# Patient Record
Sex: Male | Born: 1935
Health system: Southern US, Community
[De-identification: ages and names within clinical notes are randomized; demographics above are authoritative.]

## PROBLEM LIST (undated history)

## (undated) DIAGNOSIS — E119 Type 2 diabetes mellitus without complications: Secondary | ICD-10-CM

---

## 1999-11-28 ENCOUNTER — Emergency Department (HOSPITAL_COMMUNITY): Admission: EM | Admit: 1999-11-28 | Discharge: 1999-11-29 | Payer: Self-pay | Admitting: Emergency Medicine

## 2005-10-17 ENCOUNTER — Ambulatory Visit (HOSPITAL_COMMUNITY)
Admission: RE | Admit: 2005-10-17 | Discharge: 2005-10-17 | Payer: Self-pay | Admitting: Physical Medicine and Rehabilitation

## 2005-11-08 ENCOUNTER — Encounter
Admission: RE | Admit: 2005-11-08 | Discharge: 2005-11-08 | Payer: Self-pay | Admitting: Physical Medicine and Rehabilitation

## 2014-12-23 DIAGNOSIS — E1122 Type 2 diabetes mellitus with diabetic chronic kidney disease: Secondary | ICD-10-CM | POA: Diagnosis not present

## 2014-12-23 DIAGNOSIS — N08 Glomerular disorders in diseases classified elsewhere: Secondary | ICD-10-CM | POA: Diagnosis not present

## 2014-12-23 DIAGNOSIS — I129 Hypertensive chronic kidney disease with stage 1 through stage 4 chronic kidney disease, or unspecified chronic kidney disease: Secondary | ICD-10-CM | POA: Diagnosis not present

## 2014-12-23 DIAGNOSIS — N182 Chronic kidney disease, stage 2 (mild): Secondary | ICD-10-CM | POA: Diagnosis not present

## 2015-03-26 DIAGNOSIS — E1122 Type 2 diabetes mellitus with diabetic chronic kidney disease: Secondary | ICD-10-CM | POA: Diagnosis not present

## 2015-03-26 DIAGNOSIS — E782 Mixed hyperlipidemia: Secondary | ICD-10-CM | POA: Diagnosis not present

## 2015-03-26 DIAGNOSIS — E559 Vitamin D deficiency, unspecified: Secondary | ICD-10-CM | POA: Diagnosis not present

## 2015-03-26 DIAGNOSIS — I129 Hypertensive chronic kidney disease with stage 1 through stage 4 chronic kidney disease, or unspecified chronic kidney disease: Secondary | ICD-10-CM | POA: Diagnosis not present

## 2015-07-01 DIAGNOSIS — E1122 Type 2 diabetes mellitus with diabetic chronic kidney disease: Secondary | ICD-10-CM | POA: Diagnosis not present

## 2015-07-01 DIAGNOSIS — Z0000 Encounter for general adult medical examination without abnormal findings: Secondary | ICD-10-CM | POA: Diagnosis not present

## 2015-07-01 DIAGNOSIS — E782 Mixed hyperlipidemia: Secondary | ICD-10-CM | POA: Diagnosis not present

## 2015-07-01 DIAGNOSIS — N182 Chronic kidney disease, stage 2 (mild): Secondary | ICD-10-CM | POA: Diagnosis not present

## 2015-07-01 DIAGNOSIS — H6123 Impacted cerumen, bilateral: Secondary | ICD-10-CM | POA: Diagnosis not present

## 2015-07-01 DIAGNOSIS — I129 Hypertensive chronic kidney disease with stage 1 through stage 4 chronic kidney disease, or unspecified chronic kidney disease: Secondary | ICD-10-CM | POA: Diagnosis not present

## 2016-04-06 DIAGNOSIS — H524 Presbyopia: Secondary | ICD-10-CM | POA: Diagnosis not present

## 2016-04-06 DIAGNOSIS — E119 Type 2 diabetes mellitus without complications: Secondary | ICD-10-CM | POA: Diagnosis not present

## 2016-09-19 DIAGNOSIS — N182 Chronic kidney disease, stage 2 (mild): Secondary | ICD-10-CM | POA: Diagnosis not present

## 2016-09-19 DIAGNOSIS — N08 Glomerular disorders in diseases classified elsewhere: Secondary | ICD-10-CM | POA: Diagnosis not present

## 2016-09-19 DIAGNOSIS — E782 Mixed hyperlipidemia: Secondary | ICD-10-CM | POA: Diagnosis not present

## 2016-09-19 DIAGNOSIS — E559 Vitamin D deficiency, unspecified: Secondary | ICD-10-CM | POA: Diagnosis not present

## 2016-09-19 DIAGNOSIS — Z0000 Encounter for general adult medical examination without abnormal findings: Secondary | ICD-10-CM | POA: Diagnosis not present

## 2016-09-19 DIAGNOSIS — H6123 Impacted cerumen, bilateral: Secondary | ICD-10-CM | POA: Diagnosis not present

## 2016-09-19 DIAGNOSIS — E1122 Type 2 diabetes mellitus with diabetic chronic kidney disease: Secondary | ICD-10-CM | POA: Diagnosis not present

## 2017-01-23 DIAGNOSIS — N182 Chronic kidney disease, stage 2 (mild): Secondary | ICD-10-CM | POA: Diagnosis not present

## 2017-01-23 DIAGNOSIS — E559 Vitamin D deficiency, unspecified: Secondary | ICD-10-CM | POA: Diagnosis not present

## 2017-01-23 DIAGNOSIS — E1122 Type 2 diabetes mellitus with diabetic chronic kidney disease: Secondary | ICD-10-CM | POA: Diagnosis not present

## 2017-01-23 DIAGNOSIS — I129 Hypertensive chronic kidney disease with stage 1 through stage 4 chronic kidney disease, or unspecified chronic kidney disease: Secondary | ICD-10-CM | POA: Diagnosis not present

## 2017-01-23 DIAGNOSIS — N08 Glomerular disorders in diseases classified elsewhere: Secondary | ICD-10-CM | POA: Diagnosis not present

## 2017-01-23 DIAGNOSIS — E782 Mixed hyperlipidemia: Secondary | ICD-10-CM | POA: Diagnosis not present

## 2017-04-21 ENCOUNTER — Encounter (HOSPITAL_COMMUNITY): Payer: Self-pay

## 2017-04-21 ENCOUNTER — Emergency Department (HOSPITAL_COMMUNITY): Payer: Medicare HMO

## 2017-04-21 ENCOUNTER — Inpatient Hospital Stay (HOSPITAL_COMMUNITY)
Admission: EM | Admit: 2017-04-21 | Discharge: 2017-04-23 | DRG: 871 | Disposition: A | Discharge status: Home Health | Payer: Medicare HMO | Attending: Internal Medicine | Admitting: Internal Medicine

## 2017-04-21 ENCOUNTER — Other Ambulatory Visit: Payer: Self-pay

## 2017-04-21 DIAGNOSIS — J9801 Acute bronchospasm: Secondary | ICD-10-CM | POA: Diagnosis present

## 2017-04-21 DIAGNOSIS — A4189 Other specified sepsis: Secondary | ICD-10-CM | POA: Diagnosis not present

## 2017-04-21 DIAGNOSIS — Z7984 Long term (current) use of oral hypoglycemic drugs: Secondary | ICD-10-CM

## 2017-04-21 DIAGNOSIS — G9340 Encephalopathy, unspecified: Secondary | ICD-10-CM | POA: Diagnosis not present

## 2017-04-21 DIAGNOSIS — J101 Influenza due to other identified influenza virus with other respiratory manifestations: Secondary | ICD-10-CM | POA: Diagnosis not present

## 2017-04-21 DIAGNOSIS — R4182 Altered mental status, unspecified: Secondary | ICD-10-CM | POA: Diagnosis not present

## 2017-04-21 DIAGNOSIS — Z79899 Other long term (current) drug therapy: Secondary | ICD-10-CM

## 2017-04-21 DIAGNOSIS — E876 Hypokalemia: Secondary | ICD-10-CM | POA: Diagnosis present

## 2017-04-21 DIAGNOSIS — R197 Diarrhea, unspecified: Secondary | ICD-10-CM | POA: Diagnosis not present

## 2017-04-21 DIAGNOSIS — G9341 Metabolic encephalopathy: Secondary | ICD-10-CM | POA: Diagnosis present

## 2017-04-21 DIAGNOSIS — A419 Sepsis, unspecified organism: Secondary | ICD-10-CM | POA: Diagnosis present

## 2017-04-21 DIAGNOSIS — E119 Type 2 diabetes mellitus without complications: Secondary | ICD-10-CM | POA: Diagnosis present

## 2017-04-21 DIAGNOSIS — R509 Fever, unspecified: Secondary | ICD-10-CM | POA: Diagnosis not present

## 2017-04-21 DIAGNOSIS — R0602 Shortness of breath: Secondary | ICD-10-CM | POA: Diagnosis not present

## 2017-04-21 DIAGNOSIS — F17200 Nicotine dependence, unspecified, uncomplicated: Secondary | ICD-10-CM | POA: Diagnosis present

## 2017-04-21 DIAGNOSIS — R531 Weakness: Secondary | ICD-10-CM | POA: Diagnosis not present

## 2017-04-21 DIAGNOSIS — E860 Dehydration: Secondary | ICD-10-CM | POA: Diagnosis present

## 2017-04-21 HISTORY — DX: Type 2 diabetes mellitus without complications: E11.9

## 2017-04-21 LAB — COMPREHENSIVE METABOLIC PANEL
ALT: 15 U/L — ABNORMAL LOW (ref 17–63)
AST: 34 U/L (ref 15–41)
Albumin: 3.6 g/dL (ref 3.5–5.0)
Alkaline Phosphatase: 42 U/L (ref 38–126)
Anion gap: 13 (ref 5–15)
BUN: 17 mg/dL (ref 6–20)
CHLORIDE: 100 mmol/L — AB (ref 101–111)
CO2: 25 mmol/L (ref 22–32)
Calcium: 8.9 mg/dL (ref 8.9–10.3)
Creatinine, Ser: 1 mg/dL (ref 0.61–1.24)
GFR calc Af Amer: >60 mL/min (ref >60)
Glucose, Bld: 116 mg/dL — ABNORMAL HIGH (ref 65–99)
POTASSIUM: 3 mmol/L — AB (ref 3.5–5.1)
SODIUM: 138 mmol/L (ref 135–145)
Total Bilirubin: 0.5 mg/dL (ref 0.3–1.2)
Total Protein: 7 g/dL (ref 6.5–8.1)

## 2017-04-21 LAB — CBC WITH DIFFERENTIAL/PLATELET
Basophils Absolute: 0 10*3/uL (ref 0.0–0.1)
Basophils Relative: 0 %
EOS PCT: 0 %
Eosinophils Absolute: 0 10*3/uL (ref 0.0–0.7)
HCT: 34.4 % — ABNORMAL LOW (ref 39.0–52.0)
HEMOGLOBIN: 11.4 g/dL — AB (ref 13.0–17.0)
LYMPHS ABS: 0.4 10*3/uL — AB (ref 0.7–4.0)
LYMPHS PCT: 7 %
MCH: 32.4 pg (ref 26.0–34.0)
MCHC: 33.1 g/dL (ref 30.0–36.0)
MCV: 97.7 fL (ref 78.0–100.0)
Monocytes Absolute: 0.2 10*3/uL (ref 0.1–1.0)
Monocytes Relative: 4 %
NEUTROS PCT: 89 %
Neutro Abs: 5.9 10*3/uL (ref 1.7–7.7)
Platelets: 125 10*3/uL — ABNORMAL LOW (ref 150–400)
RBC: 3.52 MIL/uL — AB (ref 4.22–5.81)
RDW: 14.1 % (ref 11.5–15.5)
WBC: 6.5 10*3/uL (ref 4.0–10.5)

## 2017-04-21 LAB — INFLUENZA PANEL BY PCR (TYPE A & B)
Influenza A By PCR: POSITIVE — AB
Influenza B By PCR: NEGATIVE

## 2017-04-21 LAB — URINALYSIS, ROUTINE W REFLEX MICROSCOPIC
BACTERIA UA: NONE SEEN
Bilirubin Urine: NEGATIVE
Glucose, UA: NEGATIVE mg/dL
KETONES UR: 5 mg/dL — AB
Leukocytes, UA: NEGATIVE
Nitrite: NEGATIVE
PROTEIN: 30 mg/dL — AB
Specific Gravity, Urine: 1.018 (ref 1.005–1.030)
Squamous Epithelial / LPF: NONE SEEN
pH: 5 (ref 5.0–8.0)

## 2017-04-21 LAB — GLUCOSE, CAPILLARY
Glucose-Capillary: 82 mg/dL (ref 65–99)
Glucose-Capillary: 128 mg/dL — ABNORMAL HIGH (ref 65–99)

## 2017-04-21 LAB — I-STAT CG4 LACTIC ACID, ED: LACTIC ACID, VENOUS: 1.83 mmol/L (ref 0.5–1.9)

## 2017-04-21 MED ORDER — MENTHOL 3 MG MT LOZG: 1.0000 | LOZENGE | OROMUCOSAL | Status: DC | PRN

## 2017-04-21 MED ORDER — ACETAMINOPHEN 325 MG PO TABS: 650.0000 mg | ORAL_TABLET | Freq: Four times a day (QID) | ORAL | Status: DC | PRN

## 2017-04-21 MED ORDER — ONDANSETRON HCL 4 MG/2ML IJ SOLN: 4.0000 mg | Freq: Four times a day (QID) | INTRAMUSCULAR | Status: DC | PRN

## 2017-04-21 MED ORDER — DM-GUAIFENESIN ER 30-600 MG PO TB12
1.0000 | ORAL_TABLET | Freq: Two times a day (BID) | ORAL | Status: DC
  Administered 2017-04-21 – 2017-04-23 (×4): 1 via ORAL
  Filled 2017-04-21 (×4): qty 1

## 2017-04-21 MED ORDER — ONDANSETRON HCL 4 MG PO TABS: 4.0000 mg | ORAL_TABLET | Freq: Four times a day (QID) | ORAL | Status: DC | PRN

## 2017-04-21 MED ORDER — ENOXAPARIN SODIUM 40 MG/0.4ML ~~LOC~~ SOLN
40.0000 mg | SUBCUTANEOUS | Status: DC
  Administered 2017-04-21: 40 mg via SUBCUTANEOUS
  Filled 2017-04-21: qty 0.4

## 2017-04-21 MED ORDER — TRAMADOL HCL 50 MG PO TABS: 50.0000 mg | ORAL_TABLET | Freq: Four times a day (QID) | ORAL | Status: DC | PRN

## 2017-04-21 MED ORDER — SODIUM CHLORIDE 0.9% FLUSH
3.0000 mL | Freq: Two times a day (BID) | INTRAVENOUS | Status: DC
  Administered 2017-04-21 – 2017-04-23 (×2): 3 mL via INTRAVENOUS

## 2017-04-21 MED ORDER — ALBUTEROL SULFATE (2.5 MG/3ML) 0.083% IN NEBU: 2.5000 mg | INHALATION_SOLUTION | Freq: Four times a day (QID) | RESPIRATORY_TRACT | Status: DC | PRN

## 2017-04-21 MED ORDER — ACETAMINOPHEN 325 MG PO TABS
650.0000 mg | ORAL_TABLET | Freq: Once | ORAL | Status: AC
  Administered 2017-04-21: 650 mg via ORAL
  Filled 2017-04-21: qty 2

## 2017-04-21 MED ORDER — INSULIN ASPART 100 UNIT/ML ~~LOC~~ SOLN
0.0000 [IU] | Freq: Three times a day (TID) | SUBCUTANEOUS | Status: DC
  Administered 2017-04-22: 2 [IU] via SUBCUTANEOUS
  Administered 2017-04-22 – 2017-04-23 (×2): 1 [IU] via SUBCUTANEOUS

## 2017-04-21 MED ORDER — INSULIN ASPART 100 UNIT/ML ~~LOC~~ SOLN
0.0000 [IU] | Freq: Every day | SUBCUTANEOUS | Status: DC
Start: 2017-04-21 — End: 2017-04-23

## 2017-04-21 MED ORDER — METHYLPREDNISOLONE SODIUM SUCC 40 MG IJ SOLR
40.0000 mg | Freq: Two times a day (BID) | INTRAMUSCULAR | Status: DC
  Administered 2017-04-21 – 2017-04-23 (×4): 40 mg via INTRAVENOUS
  Filled 2017-04-21 (×4): qty 1

## 2017-04-21 MED ORDER — ACETAMINOPHEN 650 MG RE SUPP: 650.0000 mg | Freq: Four times a day (QID) | RECTAL | Status: DC | PRN

## 2017-04-21 MED ORDER — SODIUM CHLORIDE 0.9 % IV BOLUS (SEPSIS)
1000.0000 mL | Freq: Once | INTRAVENOUS | Status: AC
  Administered 2017-04-21: 1000 mL via INTRAVENOUS

## 2017-04-21 MED ORDER — BENZONATATE 100 MG PO CAPS
100.0000 mg | ORAL_CAPSULE | Freq: Two times a day (BID) | ORAL | Status: DC
  Administered 2017-04-21 – 2017-04-23 (×4): 100 mg via ORAL
  Filled 2017-04-21 (×4): qty 1

## 2017-04-21 MED ORDER — SENNOSIDES-DOCUSATE SODIUM 8.6-50 MG PO TABS: 1.0000 | ORAL_TABLET | Freq: Every evening | ORAL | Status: DC | PRN

## 2017-04-21 MED ORDER — OSELTAMIVIR PHOSPHATE 30 MG PO CAPS
30.0000 mg | ORAL_CAPSULE | Freq: Two times a day (BID) | ORAL | Status: DC
  Administered 2017-04-21 – 2017-04-23 (×4): 30 mg via ORAL
  Filled 2017-04-21 (×4): qty 1

## 2017-04-21 MED ORDER — ATORVASTATIN CALCIUM 10 MG PO TABS
10.0000 mg | ORAL_TABLET | Freq: Every day | ORAL | Status: DC
  Administered 2017-04-21 – 2017-04-23 (×3): 10 mg via ORAL
  Filled 2017-04-21 (×3): qty 1

## 2017-04-21 MED ORDER — SODIUM CHLORIDE 0.9 % IV SOLN
INTRAVENOUS | Status: DC
  Administered 2017-04-21 – 2017-04-22 (×2): via INTRAVENOUS

## 2017-04-21 MED ORDER — SODIUM CHLORIDE 0.9 % IV BOLUS (SEPSIS)
500.0000 mL | Freq: Once | INTRAVENOUS | Status: AC
  Administered 2017-04-21: 500 mL via INTRAVENOUS

## 2017-04-21 NOTE — ED Triage Notes (Signed)
Per EMS, pt is coming from home with flu like symptoms and altered mental status starting last night. Per EMS pt does not have dementia and is usually AO x4. EMS reports that pt is very weak. Family also reports incontinence of bowel and bladder. Pt has a hx of diabetes and hypertension.

## 2017-04-21 NOTE — ED Notes (Signed)
Bed: QM21 Expected date:  Expected time:  Means of arrival:  Comments: AMS

## 2017-04-21 NOTE — Progress Notes (Signed)
Patient arrived on unit via stretcher from ED.  No family at bedside.  Telemetry placed per MD order and CMT notified.  

## 2017-04-21 NOTE — H&P (Signed)
[facility] History and Physical   Patient: Joseph Olson EVO:[id number]   PCP: Glendale Chard, MD DOB: 1927-08-15   DOA: 04/21/2017   DOS: 04/21/2017   DOS: the patient was seen and examined on 04/21/2017  Patient coming from: The patient is coming from home.  Chief Complaint: Confusion  HPI: Joseph Olson is a 84 y.o. male with Past medical history of type 2 diabetes mellitus. Brought in by family with confusion.  At the time of my evaluation no family letter was available.  Patient was able to provide full history.  Denies any nausea or vomiting the time of my evaluate to have some vomiting at home as well as diarrhea at home.  Denies having any complaints of chest pain or abdominal pain but does have cough as well as shortness of breath.  Also fever with chills is reported.  This is all ongoing for last 1 day only.  No fall no trauma no injury reported.  No recent change in medications reported as well. Refuses substance abuse other than active smoker.  ED Course: Found to have influenza positive.  Hypotensive tachycardic and was referred for admission  At his baseline ambulates without any support And is independent for most of his ADL; manages his medication on his own.  Review of Systems: as mentioned in the history of present illness.  All other systems reviewed and are negative.  Past Medical History:  Diagnosis Date  . Diabetes mellitus without complication ([place])    History reviewed. No pertinent surgical history. Social History:  reports that he has been smoking.  he has never used smokeless tobacco. He reports that he drinks about 1.2 oz of alcohol per week. He reports that he does not use drugs.  Not on File   History reviewed. No pertinent family history.   Prior to Admission medications   Medication Sig Start Date End Date Taking? Authorizing Provider  atorvastatin (LIPITOR) 10 MG tablet Take 10 mg by mouth daily. 04/07/17  Yes [provider]  GARLIC PO  Take 1 tablet by mouth daily.   Yes [provider]  hydrochlorothiazide (MICROZIDE) 12.5 MG capsule Take 12.5 mg by mouth daily. 04/07/17  Yes [provider]  lisinopril (PRINIVIL,ZESTRIL) 10 MG tablet Take 10 mg by mouth daily. 04/18/17  Yes [provider]  metFORMIN (GLUCOPHAGE-XR) 500 MG 24 hr tablet Take 500 mg by mouth daily. 04/07/17  Yes [provider]  Omega-3 Fatty Acids (FISH OIL PO) Take 1 tablet by mouth daily.   Yes [provider]    Physical Exam: Vitals:   04/21/17 1339 04/21/17 1612 04/21/17 1621 04/21/17 1709  BP:  (!) 100/39 (!) 96/46 (!) 91/56  Pulse:   72 71  Resp:   (!) 22 (!) 21  Temp:    (!) 100.6 F (38.1 C)  TempSrc:    Oral  SpO2:   94% 96%  Weight: 75.8 kg (167 lb)   69.9 kg (154 lb 1.6 oz)  Height: 5\' 7"  (1.702 m)   5\' 6"  (1.676 m)    General: Alert, Awake and Oriented to Time, Place and Person. Appear in moderate distress, affect appropriate Eyes: PERRL, Conjunctiva normal ENT: Oral Mucosa clear moist. Neck: no JVD, no Abnormal Mass Or lumps Cardiovascular: S1 and S2 Present, aortic systolic  Murmur, Peripheral Pulses Present Respiratory: normal respiratory effort, Bilateral Air entry equal and Decreased, no use of accessory muscle, bilateral  Crackles, no wheezes Abdomen: Bowel Sound present, Soft and no tenderness, no  hernia Skin: no redness, no Rash, no induration Extremities: no Pedal edema, no calf tenderness Neurologic: Grossly no focal neuro deficit. Bilaterally Equal motor strength  Labs on Admission:  CBC: Recent Labs  Lab 04/21/17 1048  WBC 6.5  NEUTROABS 5.9  HGB 11.4*  HCT 34.4*  MCV 97.7  PLT 630*   Basic Metabolic Panel: Recent Labs  Lab 04/21/17 1048  NA 138  K 3.0*  CL 100*  CO2 25  GLUCOSE 116*  BUN 17  CREATININE 1.00  CALCIUM 8.9   GFR: Estimated Creatinine Clearance: 47 mL/min (by C-G formula based on SCr of 1 mg/dL). Liver Function Tests: Recent Labs  Lab  04/21/17 1048  AST 34  ALT 15*  ALKPHOS 42  BILITOT 0.5  PROT 7.0  ALBUMIN 3.6   No results for input(s): LIPASE, AMYLASE in the last 168 hours. No results for input(s): AMMONIA in the last 168 hours. Coagulation Profile: No results for input(s): INR, PROTIME in the last 168 hours. Cardiac Enzymes: No results for input(s): CKTOTAL, CKMB, CKMBINDEX, TROPONINI in the last 168 hours. BNP (last 3 results) No results for input(s): PROBNP in the last 8760 hours. HbA1C: No results for input(s): HGBA1C in the last 72 hours. CBG: Recent Labs  Lab 04/21/17 1651  GLUCAP 82   Lipid Profile: No results for input(s): CHOL, HDL, LDLCALC, TRIG, CHOLHDL, LDLDIRECT in the last 72 hours. Thyroid Function Tests: No results for input(s): TSH, T4TOTAL, FREET4, T3FREE, THYROIDAB in the last 72 hours. Anemia Panel: No results for input(s): VITAMINB12, FOLATE, FERRITIN, TIBC, IRON, RETICCTPCT in the last 72 hours. Urine analysis:    Component Value Date/Time   COLORURINE YELLOW 04/21/2017 1330   APPEARANCEUR CLEAR 04/21/2017 1330   LABSPEC 1.018 04/21/2017 1330   PHURINE 5.0 04/21/2017 1330   GLUCOSEU NEGATIVE 04/21/2017 1330   HGBUR MODERATE (A) 04/21/2017 1330   BILIRUBINUR NEGATIVE 04/21/2017 1330   KETONESUR 5 (A) 04/21/2017 1330   PROTEINUR 30 (A) 04/21/2017 1330   NITRITE NEGATIVE 04/21/2017 1330   LEUKOCYTESUR NEGATIVE 04/21/2017 1330    Radiological Exams on Admission: Dg Chest Port 1 View  Result Date: 04/21/2017 CLINICAL DATA:  Fever, shortness of breath and lethargy for 2 days. Flu like symptoms with altered mental status. EXAM: PORTABLE CHEST 1 VIEW COMPARISON:  None. FINDINGS: 1103 hours. The heart size and mediastinal contours are normal. There is mild aortic atherosclerosis. The lungs are clear. There is no pleural effusion or pneumothorax. No acute osseous findings are evident. Telemetry leads overlie the chest. There is a mild scoliosis. IMPRESSION: No active cardiopulmonary  process.  Aortic atherosclerosis. Electronically Signed   By: Richardean Sale M.D.   On: 04/21/2017 11:26   Assessment/Plan 1.  Sepsis due to influenza. Acute encephalopathy, metabolic Chest x-ray negative for any pneumonia. Exam reveals bilateral crackles as well as bronchospasm. Presents with fever, tachycardia, leukocytosis, tachypnea, hypotension. Received 1.5 L bolus in the ED, will continue with 1 more liter bolus. Continue with aggressive IV hydration following that. Duo nebs, Mucinex, Tamiflu, will add also Solu-Medrol. Monitor on telemetry for now. Supportive measures for fever.  No indication for antibiotic for now. Likely encephalopathy on admission was due to fever as well as dehydration. Monitor for now on telemetry.  2.  Type 2 diabetes mellitus. Check hemoglobin A1c tomorrow. On sliding scale insulin for now.  3.  Diarrhea. Present on admission. Monitor. Likely due to influenza.  4.  Active smoker. Patient was counseled for quitting smoking. Nicotine patch for now.  Nutrition: carb modified diet DVT Prophylaxis: subcutaneous Heparin  Advance goals of care discussion: full code   Consults: none  Family Communication: no family was present at bedside, at the time of interview.  Disposition: Admitted as inpatient, telemetry unit. Likely to be discharged home, in 3-4 days.  Author: Berle Mull, MD [facility] Pager: [phone] 04/21/2017  If 7PM-7AM, please contact night-coverage [email or web address] Password [email or web address]

## 2017-04-21 NOTE — ED Provider Notes (Signed)
[facility] DEPT Provider Note   CSN: [id number] Arrival date & time: 04/21/17  2130     History   Chief Complaint Chief Complaint  Patient presents with  . Flu symptoms  . AMS    HPI Maya Scholer is a 84 y.o. male.  HPI Level 5 caveat due to altered mental status. Patient reportedly has had fever.  Confused since last night.  Has been unable to get up.  Was having diarrhea and cough.  Patient is awake but still mildly confused.  Able answer many questions however.  Patient states that he feels fine.  No abdominal pain.  Patient's history also came from the nephew.   Past medical history includes hypertension high cholesterol and diabetes. There are no active problems to display for this patient.      Home Medications    Prior to Admission medications   Medication Sig Start Date End Date Taking? Authorizing Provider  atorvastatin (LIPITOR) 10 MG tablet Take 10 mg by mouth daily. 04/07/17  Yes [provider]  GARLIC PO Take 1 tablet by mouth daily.   Yes [provider]  hydrochlorothiazide (MICROZIDE) 12.5 MG capsule Take 12.5 mg by mouth daily. 04/07/17  Yes [provider]  lisinopril (PRINIVIL,ZESTRIL) 10 MG tablet Take 10 mg by mouth daily. 04/18/17  Yes [provider]  metFORMIN (GLUCOPHAGE-XR) 500 MG 24 hr tablet Take 500 mg by mouth daily. 04/07/17  Yes [provider]  Omega-3 Fatty Acids (FISH OIL PO) Take 1 tablet by mouth daily.   Yes [provider]    Family History No family history on file.  Social History Social History   Tobacco Use  . Smoking status: Not on file  Substance Use Topics  . Alcohol use: Not on file  . Drug use: Not on file     Allergies   Patient has no allergy information on record.   Review of Systems Review of Systems  Unable to perform ROS: Mental status change     Physical Exam Updated Vital Signs BP (!) 105/55   Pulse 75   Temp  (!) 103 F (39.4 C) (Oral)   Resp (!) 26   Ht 5\' 7"  (1.702 m)   Wt 75.8 kg (167 lb)   SpO2 100%   BMI 26.16 kg/m   Physical Exam  Constitutional: He appears well-developed.  HENT:  Head: Normocephalic.  Eyes: EOM are normal.  Neck: Neck supple.  Cardiovascular: Normal rate.  Pulmonary/Chest:  Mildly harsh breath sounds diffusely.  Abdominal: There is no tenderness.  Musculoskeletal: He exhibits no edema.  Neurological: He is alert.  Awake and pleasant but mild confusion.  Skin: Skin is warm. Capillary refill takes less than 2 seconds.     ED Treatments / Results  Labs (all labs ordered are listed, but only abnormal results are displayed) Labs Reviewed  COMPREHENSIVE METABOLIC PANEL - Abnormal; Notable for the following components:      Result Value   Potassium 3.0 (*)    Chloride 100 (*)    Glucose, Bld 116 (*)    ALT 15 (*)    All other components within normal limits  CBC WITH DIFFERENTIAL/PLATELET - Abnormal; Notable for the following components:   RBC 3.52 (*)    Hemoglobin 11.4 (*)    HCT 34.4 (*)    Platelets 125 (*)    Lymphs Abs 0.4 (*)    All other components within normal limits  URINALYSIS, ROUTINE W REFLEX MICROSCOPIC -  Abnormal; Notable for the following components:   Hgb urine dipstick MODERATE (*)    Ketones, ur 5 (*)    Protein, ur 30 (*)    All other components within normal limits  INFLUENZA PANEL BY PCR (TYPE A & B) - Abnormal; Notable for the following components:   Influenza A By PCR POSITIVE (*)    All other components within normal limits  CULTURE, BLOOD (ROUTINE X 2)  CULTURE, BLOOD (ROUTINE X 2)  I-STAT CG4 LACTIC ACID, ED    EKG  EKG Interpretation  Date/Time:  Friday April 21 2017 10:31:41 EST Ventricular Rate:  80 PR Interval:    QRS Duration: 93 QT Interval:  457 QTC Calculation: 528 R Axis:   85 Text Interpretation:  Sinus rhythm Prolonged PR interval Consider right ventricular hypertrophy Nonspecific repol abnormality,  lateral leads Prolonged QT interval Confirmed by Davonna Belling [phone]) on 04/21/2017 12:14:55 PM       Radiology Dg Chest Port 1 View  Result Date: 04/21/2017 CLINICAL DATA:  Fever, shortness of breath and lethargy for 2 days. Flu like symptoms with altered mental status. EXAM: PORTABLE CHEST 1 VIEW COMPARISON:  None. FINDINGS: 1103 hours. The heart size and mediastinal contours are normal. There is mild aortic atherosclerosis. The lungs are clear. There is no pleural effusion or pneumothorax. No acute osseous findings are evident. Telemetry leads overlie the chest. There is a mild scoliosis. IMPRESSION: No active cardiopulmonary process.  Aortic atherosclerosis. Electronically Signed   By: Richardean Sale M.D.   On: 04/21/2017 11:26    Procedures Procedures (including critical care time)  Medications Ordered in ED Medications  oseltamivir (TAMIFLU) capsule 75 mg (not administered)  sodium chloride 0.9 % bolus 1,000 mL (0 mLs Intravenous Stopped 04/21/17 1318)  acetaminophen (TYLENOL) tablet 650 mg (650 mg Oral Given 04/21/17 1106)     Initial Impression / Assessment and Plan / ED Course  I have reviewed the triage vital signs and the nursing notes.  Pertinent labs & imaging results that were available during my care of the patient were reviewed by me and considered in my medical decision making (see chart for details).     Patient with fever and mental status changes.  Has had a cough.  Patient reportedly was too weak to get up off the bathroom today.  Has had some loss of bladder and bowel control.  Febrile here.  X-ray does not show pneumonia.  Urinalysis does not show infection.  Normal lactic acid.  However is flu a positive.  With the confusion that she has had at home with it and generalized weakness along with age and comorbidities I feel patient would benefit from admission.  Will admit to hospitalist.  Final Clinical Impressions(s) / ED Diagnoses   Final diagnoses:  Influenza  A  Encephalopathy    ED Discharge Orders    None       Davonna Belling, MD 04/21/17 1536

## 2017-04-22 LAB — GLUCOSE, CAPILLARY
Glucose-Capillary: 130 mg/dL — ABNORMAL HIGH (ref 65–99)
Glucose-Capillary: 158 mg/dL — ABNORMAL HIGH (ref 65–99)
Glucose-Capillary: 119 mg/dL — ABNORMAL HIGH (ref 65–99)
Glucose-Capillary: 121 mg/dL — ABNORMAL HIGH (ref 65–99)

## 2017-04-22 LAB — BASIC METABOLIC PANEL
Anion gap: 8 (ref 5–15)
BUN: 15 mg/dL (ref 6–20)
CHLORIDE: 111 mmol/L (ref 101–111)
CO2: 22 mmol/L (ref 22–32)
Calcium: 7.8 mg/dL — ABNORMAL LOW (ref 8.9–10.3)
Creatinine, Ser: 0.76 mg/dL (ref 0.61–1.24)
GFR calc Af Amer: >60 mL/min (ref >60)
GFR calc non Af Amer: >60 mL/min (ref >60)
GLUCOSE: 132 mg/dL — AB (ref 65–99)
POTASSIUM: 3.2 mmol/L — AB (ref 3.5–5.1)
Sodium: 141 mmol/L (ref 135–145)

## 2017-04-22 LAB — CBC
HEMATOCRIT: 29.6 % — AB (ref 39.0–52.0)
Hemoglobin: 9.9 g/dL — ABNORMAL LOW (ref 13.0–17.0)
MCH: 32.8 pg (ref 26.0–34.0)
MCHC: 33.4 g/dL (ref 30.0–36.0)
MCV: 98 fL (ref 78.0–100.0)
Platelets: 99 10*3/uL — ABNORMAL LOW (ref 150–400)
RBC: 3.02 MIL/uL — ABNORMAL LOW (ref 4.22–5.81)
RDW: 14.4 % (ref 11.5–15.5)
WBC: 5.5 10*3/uL (ref 4.0–10.5)

## 2017-04-22 LAB — HEMOGLOBIN A1C
Hgb A1c MFr Bld: 5.3 % (ref 4.8–5.6)
Mean Plasma Glucose: 105.41 mg/dL

## 2017-04-22 LAB — MAGNESIUM: Magnesium: 2 mg/dL (ref 1.7–2.4)

## 2017-04-22 MED ORDER — POTASSIUM CHLORIDE CRYS ER 20 MEQ PO TBCR
40.0000 meq | EXTENDED_RELEASE_TABLET | Freq: Once | ORAL | Status: AC
  Administered 2017-04-22: 40 meq via ORAL
  Filled 2017-04-22: qty 2

## 2017-04-22 NOTE — Progress Notes (Signed)
[facility] Progress Note  Patient: Joseph Olson [id number]   PCP: Glendale Chard, MD DOB: 1927-08-09   DOA: 04/21/2017   DOS: 04/22/2017   Date of Service: the patient was seen and examined on 04/22/2017  Subjective: Feeling better, continues to have cough.  No nausea no vomiting.  Mild respiratory distress.  No chest pain abdominal pain.  Brief hospital course: Pt. with PMH of type II DM; admitted on 04/21/2017, presented with complaint of confusion, was found to have influenza with sepsis. Currently further plan is continue current management.  Assessment and Plan: 1.  Sepsis due to influenza. Acute encephalopathy, metabolic Chest x-ray negative for any pneumonia. Exam reveals bilateral crackles as well as bronchospasm. Presents with fever, tachycardia, leukocytosis, tachypnea, hypotension. Received 1.5 L bolus in the ED, will continue with 1 more liter bolus. Continue with aggressive IV hydration following that. Duo nebs, Mucinex, Tamiflu, will add also Solu-Medrol. Monitor on telemetry for now. Supportive measures for fever.  No indication for antibiotic for now. Likely encephalopathy on admission was due to fever as well as dehydration. Monitor for now on telemetry.  2.  Type 2 diabetes mellitus. Hemoglobin A1c 5.3, well controlled. On sliding scale insulin for now.  3.  Diarrhea. Present on admission. Currently resolved.  Monitor.  Due to influenza.  4.  Active smoker. Patient was counseled for quitting smoking. Nicotine patch for now.  5.  Hypokalemia. Replaced.  Diet: Carb modified diet DVT Prophylaxis: subcutaneous Heparin  Advance goals of care discussion: full code  Family Communication: family was present at bedside, at the time of interview. The pt provided permission to discuss medical plan with the family. Opportunity was given to ask question and all questions were answered satisfactorily.   Disposition:  Discharge to home.  Consultants:  none Procedures: none  Antibiotics: Anti-infectives (From admission, onward)   Start     Dose/Rate Route Frequency Ordered Stop   04/21/17 1600  oseltamivir (TAMIFLU) capsule 30 mg     30 mg Oral 2 times daily 04/21/17 1534         Objective: Physical Exam: Vitals:   04/21/17 1709 04/21/17 2100 04/22/17 0540 04/22/17 1414  BP: (!) 91/56 (!) 99/50 (!) 97/49 (!) 94/46  Pulse: 71 63 66   Resp: (!) 21 20 20  (!) 24  Temp: (!) 100.6 F (38.1 C) 100 F (37.8 C) 99.4 F (37.4 C) 98.1 F (36.7 C)  TempSrc: Oral Oral Oral Oral  SpO2: 96% 93% 91% 100%  Weight: 69.9 kg (154 lb 1.6 oz)  71.9 kg (158 lb 8.2 oz)   Height: 5\' 6"  (1.676 m)       Intake/Output Summary (Last 24 hours) at 04/22/2017 1857 Last data filed at 04/22/2017 1800 Gross per 24 hour  Intake 2678.42 ml  Output 800 ml  Net 1878.42 ml   Filed Weights   04/21/17 1339 04/21/17 1709 04/22/17 0540  Weight: 75.8 kg (167 lb) 69.9 kg (154 lb 1.6 oz) 71.9 kg (158 lb 8.2 oz)   General: Alert, Awake and Oriented to Time, Place and Person. Appear in mild distress, affect appropriate Eyes: PERRL, Conjunctiva normal ENT: Oral Mucosa clear moist. Neck: no JVD, no Abnormal Mass Or lumps Cardiovascular: S1 and S2 Present, no Murmur, Peripheral Pulses Present Respiratory: normal respiratory effort, Bilateral Air entry equal and Decreased, no use of accessory muscle, bilateral Crackles, no wheezes Abdomen: Bowel Sound present, Soft and no tenderness, no hernia Skin: no redness, no Rash, no induration Extremities: no Pedal edema, no  calf tenderness Neurologic: Grossly no focal neuro deficit. Bilaterally Equal motor strength  Data Reviewed: CBC: Recent Labs  Lab 04/21/17 1048 04/22/17 0504  WBC 6.5 5.5  NEUTROABS 5.9  --   HGB 11.4* 9.9*  HCT 34.4* 29.6*  MCV 97.7 98.0  PLT 125* 99*   Basic Metabolic Panel: Recent Labs  Lab 04/21/17 1048 04/22/17 0504  NA 138 141  K 3.0* 3.2*  CL 100* 111  CO2 25 22  GLUCOSE 116*  132*  BUN 17 15  CREATININE 1.00 0.76  CALCIUM 8.9 7.8*  MG  --  2.0    Liver Function Tests: Recent Labs  Lab 04/21/17 1048  AST 34  ALT 15*  ALKPHOS 42  BILITOT 0.5  PROT 7.0  ALBUMIN 3.6   No results for input(s): LIPASE, AMYLASE in the last 168 hours. No results for input(s): AMMONIA in the last 168 hours. Coagulation Profile: No results for input(s): INR, PROTIME in the last 168 hours. Cardiac Enzymes: No results for input(s): CKTOTAL, CKMB, CKMBINDEX, TROPONINI in the last 168 hours. BNP (last 3 results) No results for input(s): PROBNP in the last 8760 hours. CBG: Recent Labs  Lab 04/21/17 1651 04/21/17 2138 04/22/17 0753 04/22/17 1222 04/22/17 1646  GLUCAP 82 128* 130* 158* 119*   Studies: No results found.  Scheduled Meds: . atorvastatin  10 mg Oral Daily  . benzonatate  100 mg Oral BID  . dextromethorphan-guaiFENesin  1 tablet Oral BID  . insulin aspart  0-5 Units Subcutaneous QHS  . insulin aspart  0-9 Units Subcutaneous TID WC  . methylPREDNISolone (SOLU-MEDROL) injection  40 mg Intravenous Q12H  . oseltamivir  30 mg Oral BID  . sodium chloride flush  3 mL Intravenous Q12H   Continuous Infusions: . sodium chloride 75 mL/hr at 04/22/17 1800   PRN Meds: acetaminophen **OR** acetaminophen, albuterol, menthol-cetylpyridinium, ondansetron **OR** ondansetron (ZOFRAN) IV, senna-docusate, traMADol  Time spent: 35 minutes  Author: Berle Mull, MD [facility] Pager: [phone] 04/22/2017 6:57 PM  If 7PM-7AM, please contact night-coverage at [email or web address], password [facility]

## 2017-04-23 LAB — GLUCOSE, CAPILLARY
GLUCOSE-CAPILLARY: 124 mg/dL — AB (ref 65–99)
GLUCOSE-CAPILLARY: 193 mg/dL — AB (ref 65–99)

## 2017-04-23 LAB — CBC
HEMATOCRIT: 36.1 % — AB (ref 39.0–52.0)
Hemoglobin: 12 g/dL — ABNORMAL LOW (ref 13.0–17.0)
MCH: 32 pg (ref 26.0–34.0)
MCHC: 33.2 g/dL (ref 30.0–36.0)
MCV: 96.3 fL (ref 78.0–100.0)
PLATELETS: 107 10*3/uL — AB (ref 150–400)
RBC: 3.75 MIL/uL — AB (ref 4.22–5.81)
RDW: 14 % (ref 11.5–15.5)
WBC: 6.2 10*3/uL (ref 4.0–10.5)

## 2017-04-23 LAB — BASIC METABOLIC PANEL
Anion gap: 6 (ref 5–15)
BUN: 15 mg/dL (ref 6–20)
CHLORIDE: 107 mmol/L (ref 101–111)
CO2: 23 mmol/L (ref 22–32)
CREATININE: 0.75 mg/dL (ref 0.61–1.24)
Calcium: 8.1 mg/dL — ABNORMAL LOW (ref 8.9–10.3)
Glucose, Bld: 152 mg/dL — ABNORMAL HIGH (ref 65–99)
POTASSIUM: 4.1 mmol/L (ref 3.5–5.1)
SODIUM: 136 mmol/L (ref 135–145)

## 2017-04-23 MED ORDER — OSELTAMIVIR PHOSPHATE 30 MG PO CAPS: 30.0000 mg | ORAL_CAPSULE | Freq: Two times a day (BID) | ORAL | 0 refills | Status: AC

## 2017-04-23 MED ORDER — DM-GUAIFENESIN ER 30-600 MG PO TB12: 1.0000 | ORAL_TABLET | Freq: Two times a day (BID) | ORAL | 0 refills | Status: DC

## 2017-04-23 MED ORDER — MENTHOL 3 MG MT LOZG: 1.0000 | LOZENGE | OROMUCOSAL | 12 refills | Status: DC | PRN

## 2017-04-23 MED ORDER — PREDNISONE 10 MG PO TABS: ORAL_TABLET | ORAL | 0 refills | Status: DC

## 2017-04-23 MED ORDER — BENZONATATE 100 MG PO CAPS: 100.0000 mg | ORAL_CAPSULE | Freq: Two times a day (BID) | ORAL | 0 refills | Status: DC

## 2017-04-23 NOTE — Care Management Note (Signed)
Case Management Note  Patient Details  Name: Joseph Olson MRN: [id number] Date of Birth: September 25, 1927  Subjective/Objective:  Sepsis d/t influenza, DM, diarrhea                  Action/Plan: NCM spoke to pt at bedside. Offered choice for HH/list provided. Pt agreeable to [facility] for HH. Contacted [facility] for RW for home to be delivered to room prior to dc and HH. Pt cares for his wife at home, but has children that will assist him as needed.   PCP Glendale Chard MD  Expected Discharge Date:  04/23/17               Expected Discharge Plan:  [place]  In-House Referral:  NA  Discharge planning Services  CM Consult  Post Acute Care Choice:  Home Health Choice offered to:  Patient  DME Arranged:  [facility] DME Agency:  [place]:  PT [facility] Agency:  [place]  Status of Service:  Completed, signed off  If discussed at [place] of Stay Meetings, dates discussed:    Additional Comments:  Erenest Rasher, RN 04/23/2017, 11:55 AM

## 2017-04-23 NOTE — Progress Notes (Signed)
Pt given d/c instruction. Condition stable. No changes in initial AM assessment at this time. Pt verbalzed understanding of instructions given. D/c home with family. RW as well

## 2017-04-23 NOTE — Evaluation (Signed)
Physical Therapy Evaluation & discharge Patient Details Name: Joseph Olson MRN: [id number] DOB: [birth year] Today's Date: 04/23/2017   History of Present Illness  Pt. with PMH of type II DM; admitted on 04/21/2017, presented with complaint of confusion, was found to have influenza with sepsis.  Clinical Impression  Pt ambulated well with RW and recommend one for home.  Pt is going to be going to [facility] with daughter and will hold off on any HHPT at this time.  Pt is scheduled to d/c from hospital today, and will therefore sign off at this time.  If pt's status changes, please re-order.    Follow Up Recommendations Supervision - Intermittent    Equipment Recommendations  Rolling walker with 5" wheels    Recommendations for Other Services       Precautions / Restrictions Precautions Precautions: None      Mobility  Bed Mobility Overal bed mobility: Modified Independent                Transfers Overall transfer level: Needs assistance   Transfers: Sit to/from Stand Sit to Stand: Supervision            Ambulation/Gait Ambulation/Gait assistance: Min guard Ambulation Distance (Feet): 300 Feet Assistive device: Rolling walker (2 wheeled) Gait Pattern/deviations: Step-through pattern     General Gait Details: Pt with c/o initial weakness that improved as gait progressed.  He liked the stability that the RW provided.  Stairs            Wheelchair Mobility    Modified Rankin (Stroke Patients Only)       Balance Overall balance assessment: Mild deficits observed, not formally tested                                           Pertinent Vitals/Pain Pain Assessment: No/denies pain    Home Living Family/patient expects to be discharged to:: Private residence Living Arrangements: Spouse/significant other;Children Available Help at Discharge: Family Type of Home: House Home Access: Stairs to enter   Technical brewer of Steps: 2    Home Equipment: None Additional Comments: Pt is going to be going to daughter's house in [facility]    Prior Function Level of Independence: Independent               Hand Dominance        Extremity/Trunk Assessment   Upper Extremity Assessment Upper Extremity Assessment: Overall WFL for tasks assessed    Lower Extremity Assessment Lower Extremity Assessment: Overall WFL for tasks assessed    Cervical / Trunk Assessment Cervical / Trunk Assessment: Normal  Communication   Communication: No difficulties  Cognition Arousal/Alertness: Awake/alert Behavior During Therapy: WFL for tasks assessed/performed Overall Cognitive Status: Within Functional Limits for tasks assessed                                        General Comments      Exercises     Assessment/Plan    PT Assessment Patent does not need any further PT services  PT Problem List         PT Treatment Interventions      PT Goals (Current goals can be found in the Care Plan section)  Acute Rehab PT Goals Patient Stated Goal: leave the hospital PT Goal  Formulation: All assessment and education complete, DC therapy    Frequency     Barriers to discharge        Co-evaluation               AM-PAC PT "6 Clicks" Daily Activity  Outcome Measure Difficulty turning over in bed (including adjusting bedclothes, sheets and blankets)?: None Difficulty moving from lying on back to sitting on the side of the bed? : None Difficulty sitting down on and standing up from a chair with arms (e.g., wheelchair, bedside commode, etc,.)?: A Little Help needed moving to and from a bed to chair (including a wheelchair)?: A Little Help needed walking in hospital room?: A Little Help needed climbing 3-5 steps with a railing? : A Little 6 Click Score: 20    End of Session   Activity Tolerance: Patient tolerated treatment well Patient left: in chair;with call bell/phone within reach;with  family/visitor present Nurse Communication: Mobility status PT Visit Diagnosis: Difficulty in walking, not elsewhere classified (R26.2)    Time: 6389-3734 PT Time Calculation (min) (ACUTE ONLY): 21 min   Charges:   PT Evaluation $PT Eval Low Complexity: 1 Low     PT G Codes:        Lisa Blakeman L. Tamala Julian, [place] Pager [phone] 04/23/2017   Galen Manila 04/23/2017, 11:11 AM

## 2017-04-23 NOTE — Progress Notes (Signed)
CSW informed pt dtr wishing to speak with CSW.  CSW met with pt and dtr at bedside- dtr is planning on taking patient to [facility] with her and inquired about current insurance status.  CSW informed dtr pt listed as having [employer] Medicare but does not have Medicaid listed.  Dtr would like help applying for Medicaid for patient- CSW explained that we cannnot help with Medicaid here for folks who are already insured and that we would not be capable of helping with [facility] Medicaid anyway- instructed dtr to go to local DSS in [facility] to apply.  CSW offered support and active listening regarding apparently stressful family situation- patient and wife were living in bad situation with bad family dynamics and dtr planning on removing patient from that situation- patient alert and seemingly oriented and is agreeable with plan to move to [facility] with dtr  No further questions at this time- CSW signing off  Jorge Ny, [place]ly Social Worker [phone]

## 2017-04-24 DIAGNOSIS — J111 Influenza due to unidentified influenza virus with other respiratory manifestations: Secondary | ICD-10-CM | POA: Diagnosis not present

## 2017-04-24 DIAGNOSIS — E119 Type 2 diabetes mellitus without complications: Secondary | ICD-10-CM | POA: Diagnosis not present

## 2017-04-24 DIAGNOSIS — F17210 Nicotine dependence, cigarettes, uncomplicated: Secondary | ICD-10-CM | POA: Diagnosis not present

## 2017-04-24 DIAGNOSIS — Z7984 Long term (current) use of oral hypoglycemic drugs: Secondary | ICD-10-CM | POA: Diagnosis not present

## 2017-04-25 DIAGNOSIS — Z7984 Long term (current) use of oral hypoglycemic drugs: Secondary | ICD-10-CM | POA: Diagnosis not present

## 2017-04-25 DIAGNOSIS — E119 Type 2 diabetes mellitus without complications: Secondary | ICD-10-CM | POA: Diagnosis not present

## 2017-04-25 DIAGNOSIS — F17210 Nicotine dependence, cigarettes, uncomplicated: Secondary | ICD-10-CM | POA: Diagnosis not present

## 2017-04-25 DIAGNOSIS — J111 Influenza due to unidentified influenza virus with other respiratory manifestations: Secondary | ICD-10-CM | POA: Diagnosis not present

## 2017-04-25 NOTE — Discharge Summary (Signed)
[facility] Discharge Summary   Patient: Joseph Olson ZOX:[id number]   PCP: Glendale Chard, MD DOB: 1928/01/12   Date of admission: 04/21/2017   Date of discharge: 04/23/2017   Discharge Diagnoses:  Active Problems:   Sepsis ([place])   Admitted From: home Disposition:  home  Recommendations for Outpatient Follow-up:  1. With PCP in 1-2 weeks  Follow-up Information    Glendale Chard, MD. Schedule an appointment as soon as possible for a visit in 1 week(s).   Specialty:  Internal Medicine Contact information: [address] [place] [address] [phone]          Diet recommendation: Cardiac diet carb modified  Activity: The patient is advised to gradually reintroduce usual activities.  Discharge Condition: good  Code Status: Full code  History of present illness: As per the H and P dictated on admission, "Joseph Olson is a 84 y.o. male with Past medical history of type 2 diabetes mellitus. Brought in by family with confusion.  At the time of my evaluation no family letter was available.  Patient was able to provide full history.  Denies any nausea or vomiting the time of my evaluate to have some vomiting at home as well as diarrhea at home.  Denies having any complaints of chest pain or abdominal pain but does have cough as well as shortness of breath.  Also fever with chills is reported.  This is all ongoing for last 1 day only.  No fall no trauma no injury reported.  No recent change in medications reported as well. Refuses substance abuse other than active smoker."  Hospital Course:  Summary of his active problems in the hospital is as following. 1.Sepsis due to influenza. Acute encephalopathy, metabolic Chest x-ray negative for any pneumonia. Exam reveals bilateral crackles as well as bronchospasm. Presents with fever, tachycardia, leukocytosis, tachypnea, hypotension. Received 1.5 L bolus in the ED, total 2.5 L bolus.. Followed by aggressive IV  hydration Duo nebs, Mucinex, Tamiflu, as well as IV Solu-Medrol was given. No indication for antibiotic for now. Likely encephalopathy on admission was due to fever as well as dehydration. Significantly better walked with PT and recommend no home therapy.  Oxygenation remained stable as well on room air  2.Type 2 diabetes mellitus. Hemoglobin A1c 5.3, well controlled. Continue home regimen  3.Diarrhea. Present on admission. Currently resolved.  Monitor.  Due to influenza.  4.Active smoker. Patient was counseled for quitting smoking. Nicotine patch for now.  5.  Hypokalemia. Replaced.  All other chronic medical condition were stable during the hospitalization.  Patient was *seen by physical therapy, who recommended no PT follow up needed DME which was arranged by Education officer, museum and case Freight forwarder. On the day of the discharge the patient's vitals were stable , and no other acute medical condition were reported by patient. the patient was felt safe to be discharge at home with family.  Procedures and Results:  none   Consultations:  none  DISCHARGE MEDICATION: Allergies as of 04/23/2017   Not on File     Medication List    STOP taking these medications   hydrochlorothiazide 12.5 MG capsule Commonly known as:  MICROZIDE   lisinopril 10 MG tablet Commonly known as:  PRINIVIL,ZESTRIL     TAKE these medications   atorvastatin 10 MG tablet Commonly known as:  LIPITOR Take 10 mg by mouth daily.   benzonatate 100 MG capsule Commonly known as:  TESSALON Take 1 capsule (100 mg total) by mouth 2 (two)  times daily.   dextromethorphan-guaiFENesin 30-600 MG 12hr tablet Commonly known as:  MUCINEX DM Take 1 tablet by mouth 2 (two) times daily.   FISH OIL PO Take 1 tablet by mouth daily.   GARLIC PO Take 1 tablet by mouth daily.   menthol-cetylpyridinium 3 MG lozenge Commonly known as:  CEPACOL Take 1 lozenge (3 mg total) by mouth as needed for sore throat.     metFORMIN 500 MG 24 hr tablet Commonly known as:  GLUCOPHAGE-XR Take 500 mg by mouth daily.   oseltamivir 30 MG capsule Commonly known as:  TAMIFLU Take 1 capsule (30 mg total) by mouth 2 (two) times daily for 2 days.   predniSONE 10 MG tablet Commonly known as:  DELTASONE Take 40mg  daily for 3days,Take 30mg  daily for 3days,Take 20mg  daily for 3days,Take 10mg  daily for 3days, then stop.      Not on File Discharge Instructions    Diet - low sodium heart healthy   Complete by:  As directed    Discharge instructions   Complete by:  As directed    It is important that you read following instructions as well as go over your medication list with RN to help you understand your care after this hospitalization.  Discharge Instructions: Please follow-up with PCP in one week  Please request your primary care physician to go over all Hospital Tests and Procedure/Radiological results at the follow up,  Please get all Hospital records sent to your PCP by signing hospital release before you go home.   Do not take more than prescribed Pain, Sleep and Anxiety Medications. You were cared for by a hospitalist during your hospital stay. If you have any questions about your discharge medications or the care you received while you were in the hospital after you are discharged, you can call the unit and ask to speak with the hospitalist on call if the hospitalist that took care of you is not available.  Once you are discharged, your primary care physician will handle any further medical issues. Please note that NO REFILLS for any discharge medications will be authorized once you are discharged, as it is imperative that you return to your primary care physician (or establish a relationship with a primary care physician if you do not have one) for your aftercare needs so that they can reassess your need for medications and monitor your lab values. You Must read complete instructions/literature along with  all the possible adverse reactions/side effects for all the Medicines you take and that have been prescribed to you. Take any new Medicines after you have completely understood and accept all the possible adverse reactions/side effects. Wear Seat belts while driving. If you have smoked or chewed Tobacco in the last 2 yrs please stop smoking and/or stop any Recreational drug use.   Increase activity slowly   Complete by:  As directed      Discharge Exam: Filed Weights   04/21/17 1709 04/22/17 0540 04/23/17 0523  Weight: 69.9 kg (154 lb 1.6 oz) 71.9 kg (158 lb 8.2 oz) 73.5 kg (162 lb)   Vitals:   04/22/17 2158 04/23/17 0523  BP: (!) 111/50 (!) 114/59  Pulse: (!) 55 (!) 54  Resp: 16 16  Temp: 98.2 F (36.8 C) 98 F (36.7 C)  SpO2: 96% 99%   General: Appear in no distress, no Rash; Oral Mucosa moist. Cardiovascular: S1 and S2 Present, no Murmur, no JVD Respiratory: Bilateral Air entry present and Clear to Auscultation, no Crackles, no  wheezes Abdomen: Bowel Sound present, Soft and no tenderness Extremities: no Pedal edema, no calf tenderness Neurology: Grossly no focal neuro deficit.  The results of significant diagnostics from this hospitalization (including imaging, microbiology, ancillary and laboratory) are listed below for reference.    Significant Diagnostic Studies: Dg Chest Port 1 View  Result Date: 04/21/2017 CLINICAL DATA:  Fever, shortness of breath and lethargy for 2 days. Flu like symptoms with altered mental status. EXAM: PORTABLE CHEST 1 VIEW COMPARISON:  None. FINDINGS: 1103 hours. The heart size and mediastinal contours are normal. There is mild aortic atherosclerosis. The lungs are clear. There is no pleural effusion or pneumothorax. No acute osseous findings are evident. Telemetry leads overlie the chest. There is a mild scoliosis. IMPRESSION: No active cardiopulmonary process.  Aortic atherosclerosis. Electronically Signed   By: Richardean Sale M.D.   On: 04/21/2017  11:26    Microbiology: Recent Results (from the past 240 hour(s))  Blood Culture (routine x 2)     Status: None (Preliminary result)   Collection Time: 04/21/17 10:49 AM  Result Value Ref Range Status   Specimen Description   Final    BLOOD LEFT ANTECUBITAL Performed at [facility] [address] [place], [place] [address]    Special Requests   Final    BOTTLES DRAWN AEROBIC AND ANAEROBIC Blood Culture adequate volume Performed at [facility] [address] [place], [place] [address]    Culture   Final    NO GROWTH 4 DAYS Performed at [facility], [place] [address] [place], [place] [address]    Report Status PENDING  Incomplete  Blood Culture (routine x 2)     Status: None (Preliminary result)   Collection Time: 04/21/17 10:49 AM  Result Value Ref Range Status   Specimen Description   Final    BLOOD RIGHT WRIST Performed at [facility] [address] [place], [place] [address]    Special Requests   Final    IN PEDIATRIC BOTTLE Blood Culture adequate volume Performed at [facility] [address] [place], [place] [address]    Culture   Final    NO GROWTH 4 DAYS Performed at [facility], [place] [address], [place],  [address]    Report Status PENDING  Incomplete     Labs: CBC: Recent Labs  Lab 04/21/17 1048 04/22/17 0504 04/23/17 0537  WBC 6.5 5.5 6.2  NEUTROABS 5.9  --   --   HGB 11.4* 9.9* 12.0*  HCT 34.4* 29.6* 36.1*  MCV 97.7 98.0 96.3  PLT 125* 99* 466*   Basic Metabolic Panel: Recent Labs  Lab 04/21/17 1048 04/22/17 0504 04/23/17 0537  NA 138 141 136  K 3.0* 3.2* 4.1  CL 100* 111 107  CO2 25 22 23   GLUCOSE 116* 132* 152*  BUN 17 15 15   CREATININE 1.00 0.76 0.75  CALCIUM 8.9 7.8* 8.1*  MG  --  2.0  --    Liver Function Tests: Recent Labs  Lab 04/21/17 1048  AST 34  ALT 15*  ALKPHOS 42  BILITOT 0.5  PROT 7.0  ALBUMIN 3.6   No results  for input(s): LIPASE, AMYLASE in the last 168 hours. No results for input(s): AMMONIA in the last 168 hours. Cardiac Enzymes: No results for input(s): CKTOTAL, CKMB, CKMBINDEX, TROPONINI in the last 168 hours. BNP (last 3 results) No results for input(s): BNP in the last 8760 hours. CBG: Recent Labs  Lab 04/22/17 1222 04/22/17 1646 04/22/17 2200 04/23/17 0831 04/23/17 1206  GLUCAP 158* 119* 121* 124* 193*   Time spent: 35 minutes  Signed:  Berle Mull  [facility] 04/23/2017 , 10:12 PM

## 2017-04-26 LAB — CULTURE, BLOOD (ROUTINE X 2)
Culture: NO GROWTH
Culture: NO GROWTH
Special Requests: ADEQUATE
Special Requests: ADEQUATE

## 2017-04-27 DIAGNOSIS — J111 Influenza due to unidentified influenza virus with other respiratory manifestations: Secondary | ICD-10-CM | POA: Diagnosis not present

## 2017-04-27 DIAGNOSIS — E119 Type 2 diabetes mellitus without complications: Secondary | ICD-10-CM | POA: Diagnosis not present

## 2017-04-27 DIAGNOSIS — F17210 Nicotine dependence, cigarettes, uncomplicated: Secondary | ICD-10-CM | POA: Diagnosis not present

## 2017-04-27 DIAGNOSIS — Z7984 Long term (current) use of oral hypoglycemic drugs: Secondary | ICD-10-CM | POA: Diagnosis not present

## 2017-05-01 DIAGNOSIS — Z8709 Personal history of other diseases of the respiratory system: Secondary | ICD-10-CM | POA: Diagnosis not present

## 2017-05-01 DIAGNOSIS — Z8619 Personal history of other infectious and parasitic diseases: Secondary | ICD-10-CM | POA: Diagnosis not present

## 2017-05-01 DIAGNOSIS — Z09 Encounter for follow-up examination after completed treatment for conditions other than malignant neoplasm: Secondary | ICD-10-CM | POA: Diagnosis not present

## 2017-05-01 DIAGNOSIS — I10 Essential (primary) hypertension: Secondary | ICD-10-CM | POA: Diagnosis not present

## 2017-05-02 DIAGNOSIS — Z7984 Long term (current) use of oral hypoglycemic drugs: Secondary | ICD-10-CM | POA: Diagnosis not present

## 2017-05-02 DIAGNOSIS — F17210 Nicotine dependence, cigarettes, uncomplicated: Secondary | ICD-10-CM | POA: Diagnosis not present

## 2017-05-02 DIAGNOSIS — E119 Type 2 diabetes mellitus without complications: Secondary | ICD-10-CM | POA: Diagnosis not present

## 2017-05-02 DIAGNOSIS — J111 Influenza due to unidentified influenza virus with other respiratory manifestations: Secondary | ICD-10-CM | POA: Diagnosis not present

## 2017-05-05 DIAGNOSIS — Z7984 Long term (current) use of oral hypoglycemic drugs: Secondary | ICD-10-CM | POA: Diagnosis not present

## 2017-05-05 DIAGNOSIS — F17210 Nicotine dependence, cigarettes, uncomplicated: Secondary | ICD-10-CM | POA: Diagnosis not present

## 2017-05-05 DIAGNOSIS — J111 Influenza due to unidentified influenza virus with other respiratory manifestations: Secondary | ICD-10-CM | POA: Diagnosis not present

## 2017-05-05 DIAGNOSIS — E119 Type 2 diabetes mellitus without complications: Secondary | ICD-10-CM | POA: Diagnosis not present

## 2017-05-08 DIAGNOSIS — E119 Type 2 diabetes mellitus without complications: Secondary | ICD-10-CM | POA: Diagnosis not present

## 2017-05-08 DIAGNOSIS — F17210 Nicotine dependence, cigarettes, uncomplicated: Secondary | ICD-10-CM | POA: Diagnosis not present

## 2017-05-08 DIAGNOSIS — J111 Influenza due to unidentified influenza virus with other respiratory manifestations: Secondary | ICD-10-CM | POA: Diagnosis not present

## 2017-05-08 DIAGNOSIS — Z7984 Long term (current) use of oral hypoglycemic drugs: Secondary | ICD-10-CM | POA: Diagnosis not present

## 2017-05-10 DIAGNOSIS — F17210 Nicotine dependence, cigarettes, uncomplicated: Secondary | ICD-10-CM | POA: Diagnosis not present

## 2017-05-10 DIAGNOSIS — E119 Type 2 diabetes mellitus without complications: Secondary | ICD-10-CM | POA: Diagnosis not present

## 2017-05-10 DIAGNOSIS — Z7984 Long term (current) use of oral hypoglycemic drugs: Secondary | ICD-10-CM | POA: Diagnosis not present

## 2017-05-10 DIAGNOSIS — J111 Influenza due to unidentified influenza virus with other respiratory manifestations: Secondary | ICD-10-CM | POA: Diagnosis not present

## 2017-05-16 DIAGNOSIS — E119 Type 2 diabetes mellitus without complications: Secondary | ICD-10-CM | POA: Diagnosis not present

## 2017-05-16 DIAGNOSIS — F17210 Nicotine dependence, cigarettes, uncomplicated: Secondary | ICD-10-CM | POA: Diagnosis not present

## 2017-05-16 DIAGNOSIS — J111 Influenza due to unidentified influenza virus with other respiratory manifestations: Secondary | ICD-10-CM | POA: Diagnosis not present

## 2017-05-16 DIAGNOSIS — Z7984 Long term (current) use of oral hypoglycemic drugs: Secondary | ICD-10-CM | POA: Diagnosis not present

## 2017-05-22 DIAGNOSIS — Z7984 Long term (current) use of oral hypoglycemic drugs: Secondary | ICD-10-CM | POA: Diagnosis not present

## 2017-05-22 DIAGNOSIS — F17210 Nicotine dependence, cigarettes, uncomplicated: Secondary | ICD-10-CM | POA: Diagnosis not present

## 2017-05-22 DIAGNOSIS — E119 Type 2 diabetes mellitus without complications: Secondary | ICD-10-CM | POA: Diagnosis not present

## 2017-05-22 DIAGNOSIS — J111 Influenza due to unidentified influenza virus with other respiratory manifestations: Secondary | ICD-10-CM | POA: Diagnosis not present

## 2017-05-24 DIAGNOSIS — I10 Essential (primary) hypertension: Secondary | ICD-10-CM | POA: Diagnosis not present

## 2017-05-24 DIAGNOSIS — E782 Mixed hyperlipidemia: Secondary | ICD-10-CM | POA: Diagnosis not present

## 2017-09-07 DIAGNOSIS — H18419 Arcus senilis, unspecified eye: Secondary | ICD-10-CM | POA: Diagnosis not present

## 2017-09-07 DIAGNOSIS — Z7984 Long term (current) use of oral hypoglycemic drugs: Secondary | ICD-10-CM | POA: Diagnosis not present

## 2017-09-07 DIAGNOSIS — R6889 Other general symptoms and signs: Secondary | ICD-10-CM | POA: Diagnosis not present

## 2017-09-07 DIAGNOSIS — H11159 Pinguecula, unspecified eye: Secondary | ICD-10-CM | POA: Diagnosis not present

## 2017-09-07 DIAGNOSIS — E119 Type 2 diabetes mellitus without complications: Secondary | ICD-10-CM | POA: Diagnosis not present

## 2017-09-07 DIAGNOSIS — H5213 Myopia, bilateral: Secondary | ICD-10-CM | POA: Diagnosis not present

## 2017-09-07 DIAGNOSIS — H524 Presbyopia: Secondary | ICD-10-CM | POA: Diagnosis not present

## 2017-09-07 DIAGNOSIS — Z961 Presence of intraocular lens: Secondary | ICD-10-CM | POA: Diagnosis not present

## 2017-09-07 DIAGNOSIS — I10 Essential (primary) hypertension: Secondary | ICD-10-CM | POA: Diagnosis not present

## 2017-09-07 LAB — HM DIABETES EYE EXAM

## 2017-10-05 DIAGNOSIS — Z1331 Encounter for screening for depression: Secondary | ICD-10-CM | POA: Diagnosis not present

## 2017-10-05 DIAGNOSIS — I129 Hypertensive chronic kidney disease with stage 1 through stage 4 chronic kidney disease, or unspecified chronic kidney disease: Secondary | ICD-10-CM | POA: Diagnosis not present

## 2017-10-05 DIAGNOSIS — R6889 Other general symptoms and signs: Secondary | ICD-10-CM | POA: Diagnosis not present

## 2017-10-05 DIAGNOSIS — N182 Chronic kidney disease, stage 2 (mild): Secondary | ICD-10-CM | POA: Diagnosis not present

## 2017-10-05 DIAGNOSIS — E782 Mixed hyperlipidemia: Secondary | ICD-10-CM | POA: Diagnosis not present

## 2017-10-05 DIAGNOSIS — E559 Vitamin D deficiency, unspecified: Secondary | ICD-10-CM | POA: Diagnosis not present

## 2017-10-05 DIAGNOSIS — I10 Essential (primary) hypertension: Secondary | ICD-10-CM | POA: Diagnosis not present

## 2017-10-05 DIAGNOSIS — E1122 Type 2 diabetes mellitus with diabetic chronic kidney disease: Secondary | ICD-10-CM | POA: Diagnosis not present

## 2017-10-05 DIAGNOSIS — F0390 Unspecified dementia without behavioral disturbance: Secondary | ICD-10-CM | POA: Diagnosis not present

## 2017-11-13 DIAGNOSIS — Z1212 Encounter for screening for malignant neoplasm of rectum: Secondary | ICD-10-CM | POA: Diagnosis not present

## 2017-11-13 DIAGNOSIS — Z1211 Encounter for screening for malignant neoplasm of colon: Secondary | ICD-10-CM | POA: Diagnosis not present

## 2017-11-19 LAB — COLOGUARD: Cologuard: POSITIVE — AB

## 2017-11-27 ENCOUNTER — Telehealth: Payer: Self-pay | Admitting: Nurse Practitioner

## 2017-11-27 NOTE — Telephone Encounter (Signed)
Call the patient and make aware his cologuard was abnormal and he needs to be seen by GI for a possible colonoscopy.  If he agrees place an order for GI with Dr. Collene Mares unless he has been to another provider.

## 2017-11-28 NOTE — Telephone Encounter (Signed)
Patient stated he would be okay to see Dr.Mann and pt also stated if you could do a referral to someone who can cut his toe nails? YRL,RMA

## 2017-11-29 ENCOUNTER — Other Ambulatory Visit: Payer: Self-pay | Admitting: Nurse Practitioner

## 2017-11-29 DIAGNOSIS — R195 Other fecal abnormalities: Secondary | ICD-10-CM

## 2017-11-29 NOTE — Telephone Encounter (Signed)
Ask him if he would like for someone to come to his home, he would have to pay cash otherwise for a referral to a podiatrist he may need an office visit for insurance purposes.

## 2017-12-01 NOTE — Telephone Encounter (Signed)
Patient notified YRL,RMA 

## 2017-12-01 NOTE — Telephone Encounter (Signed)
Patient states he would like for someone to come to his home would like to know how much it would be and wants to go head and set up an appointment. YRL,RMA

## 2017-12-01 NOTE — Telephone Encounter (Signed)
I will refer him to Aging with Shirlee Limerick.  I will provide her with his number.

## 2017-12-04 ENCOUNTER — Other Ambulatory Visit: Payer: Self-pay | Admitting: Nurse Practitioner

## 2017-12-13 ENCOUNTER — Other Ambulatory Visit: Payer: Self-pay | Admitting: Internal Medicine

## 2017-12-13 ENCOUNTER — Telehealth: Payer: Self-pay | Admitting: Nurse Practitioner

## 2017-12-13 NOTE — Telephone Encounter (Signed)
Spoke with Butch Penny at Dr. Lorie Apley office concerning the order for the colonoscopy, cutoff age is 54 is there a particular reason to have this done.  Will follow up with Sunday Spillers PA as she initially ordered the cologuard.

## 2017-12-14 NOTE — Telephone Encounter (Signed)
No, it was just part of his Medicare Screen. May cancel.

## 2018-01-11 ENCOUNTER — Ambulatory Visit: Payer: Self-pay | Admitting: Internal Medicine

## 2018-01-29 ENCOUNTER — Other Ambulatory Visit: Payer: Self-pay | Admitting: Internal Medicine

## 2018-02-28 ENCOUNTER — Other Ambulatory Visit: Payer: Self-pay | Admitting: Nurse Practitioner

## 2018-03-28 ENCOUNTER — Ambulatory Visit: Payer: Self-pay | Admitting: Internal Medicine

## 2018-04-19 ENCOUNTER — Other Ambulatory Visit: Payer: Self-pay | Admitting: Nurse Practitioner

## 2018-04-20 ENCOUNTER — Telehealth: Payer: Self-pay

## 2018-04-25 NOTE — Telephone Encounter (Signed)
error 

## 2018-04-30 ENCOUNTER — Ambulatory Visit (INDEPENDENT_AMBULATORY_CARE_PROVIDER_SITE_OTHER): Payer: Medicare HMO | Admitting: Nurse Practitioner

## 2018-04-30 ENCOUNTER — Encounter: Payer: Self-pay | Admitting: Nurse Practitioner

## 2018-04-30 VITALS — BP 132/70 | HR 68 | Ht 66.0 in | Wt 170.0 lb

## 2018-04-30 DIAGNOSIS — I10 Essential (primary) hypertension: Secondary | ICD-10-CM | POA: Diagnosis not present

## 2018-04-30 DIAGNOSIS — R202 Paresthesia of skin: Secondary | ICD-10-CM | POA: Insufficient documentation

## 2018-04-30 DIAGNOSIS — R7303 Prediabetes: Secondary | ICD-10-CM | POA: Diagnosis not present

## 2018-04-30 DIAGNOSIS — R6889 Other general symptoms and signs: Secondary | ICD-10-CM | POA: Diagnosis not present

## 2018-04-30 MED ORDER — METFORMIN HCL ER 500 MG PO TB24: 500.0000 mg | ORAL_TABLET | Freq: Every day | ORAL | 1 refills | Status: DC

## 2018-04-30 MED ORDER — ATORVASTATIN CALCIUM 10 MG PO TABS: 10.0000 mg | ORAL_TABLET | Freq: Every day | ORAL | 1 refills | Status: DC

## 2018-04-30 MED ORDER — HYDROCHLOROTHIAZIDE 12.5 MG PO TABS: 12.5000 mg | ORAL_TABLET | Freq: Every day | ORAL | 1 refills | Status: DC

## 2018-04-30 NOTE — Patient Instructions (Signed)
TAKE VITAMIN b SUPPLEMENT DAILY TAKE MAGNESIUM 250MG  WITH DINNER MEAL.

## 2018-04-30 NOTE — Progress Notes (Signed)
Subjective:     Patient ID: Joseph Olson , male    DOB: 05/01/1927 , 85 y.o.   MRN: [id number]   Chief Complaint  Patient presents with  . med check    dm, htn     HPI  Hypertension  This is a chronic problem. The current episode started more than 1 year ago. The problem is unchanged. The problem is controlled. Pertinent negatives include no anxiety, blurred vision, chest pain, headaches or palpitations. There are no associated agents to hypertension. There are no known risk factors for coronary artery disease. Past treatments include diuretics. The current treatment provides no improvement. There is no history of angina. There is no history of chronic renal disease.  Diabetes  He presents for his follow-up diabetic visit. Diabetes type: prediabetes. His disease course has been stable. Pertinent negatives for hypoglycemia include no confusion, dizziness or headaches. Pertinent negatives for diabetes include no blurred vision, no chest pain, no polydipsia, no polyphagia and no polyuria. There are no hypoglycemic complications. Symptoms are stable. There are no diabetic complications. Risk factors for coronary artery disease include sedentary lifestyle and male sex.     Past Medical History:  Diagnosis Date  . Diabetes mellitus without complication ([place])      No family history on file.   Current Outpatient Medications:  .  atorvastatin (LIPITOR) 10 MG tablet, Take 1 tablet (10 mg total) by mouth daily at 6 PM., Disp: 90 tablet, Rfl: 1 .  hydrochlorothiazide (HYDRODIURIL) 12.5 MG tablet, Take 1 tablet (12.5 mg total) by mouth daily., Disp: 90 tablet, Rfl: 1 .  metFORMIN (GLUCOPHAGE-XR) 500 MG 24 hr tablet, Take 1 tablet (500 mg total) by mouth daily., Disp: 180 tablet, Rfl: 1 .  Omega-3 Fatty Acids (FISH OIL PO), Take 1 tablet by mouth daily., Disp: , Rfl:    Not on File   Review of Systems  Constitutional: Negative.   Eyes: Negative for blurred vision.  Respiratory: Negative.   Negative for cough.   Cardiovascular: Negative.  Negative for chest pain, palpitations and leg swelling.  Gastrointestinal: Negative.   Endocrine: Negative for polydipsia, polyphagia and polyuria.  Genitourinary: Negative.   Musculoskeletal: Negative.        Bilateral lower extremities with stinging.  Skin: Negative.   Neurological: Negative for dizziness and headaches.  Psychiatric/Behavioral: Negative for confusion.     Today's Vitals   04/30/18 1035  BP: 132/70  Pulse: 68  SpO2: 97%  Weight: 170 lb (77.1 kg)  Height: 5\' 6"  (1.676 m)   Body mass index is 27.44 kg/m.   Objective:  Physical Exam Vitals signs reviewed.  Constitutional:      Appearance: Normal appearance.  Cardiovascular:     Rate and Rhythm: Normal rate and regular rhythm.     Pulses: Normal pulses.     Heart sounds: Normal heart sounds. No murmur.  Pulmonary:     Effort: Pulmonary effort is normal. No respiratory distress.     Breath sounds: Normal breath sounds. No wheezing.  Musculoskeletal: Normal range of motion.        General: No swelling, tenderness or deformity.     Right lower leg: No edema.     Left lower leg: No edema.  Skin:    General: Skin is warm and dry.  Neurological:     General: No focal deficit present.     Mental Status: He is alert and oriented to person, place, and time.  Psychiatric:  Mood and Affect: Mood normal.        Behavior: Behavior normal.        Thought Content: Thought content normal.        Judgment: Judgment normal.         Assessment And Plan:     1. Essential hypertension . B/P is controlled.  . CMP ordered to check renal function.  . The importance of regular exercise and dietary modification was stressed to the patient.  - hydrochlorothiazide (HYDRODIURIL) 12.5 MG tablet; Take 1 tablet (12.5 mg total) by mouth daily.  Dispense: 90 tablet; Refill: 1  2. Prediabetes  Chronic, controlled  Continue with current medications  Encouraged to  limit intake of sugary foods and drinks  Encouraged to increase physical activity to 150 minutes per week as tolerated - CMP14 + Anion Gap - Hemoglobin A1c - atorvastatin (LIPITOR) 10 MG tablet; Take 1 tablet (10 mg total) by mouth daily at 6 PM.  Dispense: 90 tablet; Refill: 1 - metFORMIN (GLUCOPHAGE-XR) 500 MG 24 hr tablet; Take 1 tablet (500 mg total) by mouth daily.  Dispense: 180 tablet; Refill: 1  3. Tingling of skin  No abnormal findings on physical exam  Will try him on vitamin B12 and magnesium daily  Will also check Magnesium level when results return - Vitamin B12   [name], FNP

## 2018-05-01 LAB — HEMOGLOBIN A1C
ESTIMATED AVERAGE GLUCOSE: 120 mg/dL
Hgb A1c MFr Bld: 5.8 % — ABNORMAL HIGH (ref 4.8–5.6)

## 2018-05-01 LAB — CMP14 + ANION GAP
ALBUMIN: 4.2 g/dL (ref 3.6–4.6)
ALT: 13 IU/L (ref 0–44)
ANION GAP: 16 mmol/L (ref 10.0–18.0)
AST: 18 IU/L (ref 0–40)
Albumin/Globulin Ratio: 1.6 (ref 1.2–2.2)
Alkaline Phosphatase: 63 IU/L (ref 39–117)
BUN / CREAT RATIO: 14 (ref 10–24)
BUN: 13 mg/dL (ref 8–27)
Bilirubin Total: 0.3 mg/dL (ref 0.0–1.2)
CALCIUM: 9.5 mg/dL (ref 8.6–10.2)
CO2: 26 mmol/L (ref 20–29)
CREATININE: 0.96 mg/dL (ref 0.76–1.27)
Chloride: 99 mmol/L (ref 96–106)
GFR calc Af Amer: 81 mL/min/{1.73_m2} (ref >59)
GFR, EST NON AFRICAN AMERICAN: 70 mL/min/{1.73_m2} (ref >59)
GLOBULIN, TOTAL: 2.7 g/dL (ref 1.5–4.5)
Glucose: 106 mg/dL — ABNORMAL HIGH (ref 65–99)
Potassium: 4.1 mmol/L (ref 3.5–5.2)
Sodium: 141 mmol/L (ref 134–144)
Total Protein: 6.9 g/dL (ref 6.0–8.5)

## 2018-05-01 LAB — VITAMIN B12: Vitamin B-12: 345 pg/mL (ref 232–1245)

## 2018-07-20 ENCOUNTER — Other Ambulatory Visit: Payer: Self-pay

## 2018-07-20 DIAGNOSIS — R7303 Prediabetes: Secondary | ICD-10-CM

## 2018-07-20 DIAGNOSIS — I10 Essential (primary) hypertension: Secondary | ICD-10-CM

## 2018-07-20 MED ORDER — ATORVASTATIN CALCIUM 10 MG PO TABS: 10.0000 mg | ORAL_TABLET | Freq: Every day | ORAL | 1 refills | Status: DC

## 2018-07-20 MED ORDER — METFORMIN HCL ER 500 MG PO TB24: 500.0000 mg | ORAL_TABLET | Freq: Every day | ORAL | 1 refills | Status: DC

## 2018-07-20 MED ORDER — HYDROCHLOROTHIAZIDE 12.5 MG PO TABS: 12.5000 mg | ORAL_TABLET | Freq: Every day | ORAL | 1 refills | Status: DC

## 2018-08-23 ENCOUNTER — Telehealth: Payer: Self-pay | Admitting: Internal Medicine

## 2018-08-23 NOTE — Telephone Encounter (Signed)
I spoke with the patient and explained that his 10/10/2018 appointment needs to be rescheduled.  We rescheduled it to 10/11/2018. VDM (DD)

## 2018-09-07 ENCOUNTER — Encounter: Payer: Self-pay | Admitting: Internal Medicine

## 2018-10-05 ENCOUNTER — Telehealth: Payer: Self-pay | Admitting: Internal Medicine

## 2018-10-05 NOTE — Telephone Encounter (Signed)
I called the patient about his appointment on 10/11/2018.  He said that he set up his ride for 2:00, so I changed his appointment to 2:30 instead of 2:45.  I told him that I would call him if something opens up at 2:00. VDM (DD)

## 2018-10-10 ENCOUNTER — Encounter: Payer: Self-pay | Admitting: Internal Medicine

## 2018-10-10 ENCOUNTER — Ambulatory Visit: Payer: Self-pay

## 2018-10-11 ENCOUNTER — Ambulatory Visit (INDEPENDENT_AMBULATORY_CARE_PROVIDER_SITE_OTHER): Payer: Medicare HMO

## 2018-10-11 ENCOUNTER — Other Ambulatory Visit: Payer: Self-pay

## 2018-10-11 ENCOUNTER — Ambulatory Visit (INDEPENDENT_AMBULATORY_CARE_PROVIDER_SITE_OTHER): Payer: Medicare HMO | Admitting: Internal Medicine

## 2018-10-11 ENCOUNTER — Encounter: Payer: Self-pay | Admitting: Internal Medicine

## 2018-10-11 VITALS — BP 138/76 | HR 75 | Temp 98.2°F | Ht 66.2 in | Wt 176.4 lb

## 2018-10-11 DIAGNOSIS — R7303 Prediabetes: Secondary | ICD-10-CM

## 2018-10-11 DIAGNOSIS — R6889 Other general symptoms and signs: Secondary | ICD-10-CM | POA: Diagnosis not present

## 2018-10-11 DIAGNOSIS — Z1211 Encounter for screening for malignant neoplasm of colon: Secondary | ICD-10-CM

## 2018-10-11 DIAGNOSIS — Z0000 Encounter for general adult medical examination without abnormal findings: Secondary | ICD-10-CM

## 2018-10-11 DIAGNOSIS — I10 Essential (primary) hypertension: Secondary | ICD-10-CM

## 2018-10-11 DIAGNOSIS — Z125 Encounter for screening for malignant neoplasm of prostate: Secondary | ICD-10-CM

## 2018-10-11 LAB — POC HEMOCCULT BLD/STL (OFFICE/1-CARD/DIAGNOSTIC): Fecal Occult Blood, POC: NEGATIVE

## 2018-10-11 NOTE — Progress Notes (Signed)
Subjective:     Patient ID: Joseph Olson , male    DOB: [birth year] , 85 y.o.   MRN: [id number]   Chief Complaint  Patient presents with  . Annual Exam    HPI  Pt is here for general physical. Denies any complaints. Will have his annual eye xm next wek.   Past Medical History:  Diagnosis Date  . Diabetes mellitus without complication ([place])      Fam Hx- mother- DM   Current Outpatient Medications:  .  atorvastatin (LIPITOR) 10 MG tablet, Take 1 tablet (10 mg total) by mouth daily at 6 PM., Disp: 90 tablet, Rfl: 1 .  hydrochlorothiazide (HYDRODIURIL) 12.5 MG tablet, Take 1 tablet (12.5 mg total) by mouth daily., Disp: 90 tablet, Rfl: 1 .  metFORMIN (GLUCOPHAGE-XR) 500 MG 24 hr tablet, Take 1 tablet (500 mg total) by mouth daily., Disp: 180 tablet, Rfl: 1 .  Omega-3 Fatty Acids (FISH OIL PO), Take 1 tablet by mouth daily., Disp: , Rfl:    Not on File   Review of Systems  Sometimes gets R lateral elbow pain and tightness on L calf. Rest is negative.  Today's Vitals   10/11/18 1505  BP: 138/76  Pulse: 75  Temp: 98.2 F (36.8 C)  TempSrc: Oral  SpO2: 97%  Weight: 176 lb 6.4 oz (80 kg)  Height: 5' 6.2" (1.681 m)   Body mass index is 28.3 kg/m.   Objective:  Physical Exam  BP 138/76   Pulse 75   Temp 98.2 F (36.8 C) (Oral)   Ht 5' 6.2" (1.681 m)   Wt 176 lb 6.4 oz (80 kg)   SpO2 97%   BMI 28.30 kg/m   General Appearance:    Alert, cooperative, no distress, appears stated age  Head:    Normocephalic, without obvious abnormality, atraumatic  Eyes:    PERRL, conjunctiva/corneas clear, EOM's intact.  Ears:    Normal TM's and external ear canals, both ears  Nose:   Nares normal, septum midline, mucosa normal, no drainage   or sinus tenderness  Throat:   Lips, mucosa, and tongue normal; teeth and gums normal  Neck:   Supple, symmetrical, trachea midline, no adenopathy;       thyroid:  No enlargement/tenderness/nodules; no carotid   bruit  Back:     Symmetric, no  curvature, ROM normal, no CVA tenderness  Lungs:     Clear to auscultation bilaterally, respirations unlabored  Chest wall:    No tenderness or deformity  Heart:    Regular rate and rhythm, S1 and S2 normal, no murmur, rub   or gallop  Abdomen:     Soft, non-tender, bowel sounds active all four quadrants,    no masses, no organomegaly  Genitalia:    Normal male without lesion, discharge or tenderness  Rectal:    Normal tone, normal prostate, no masses or tenderness;   guaiac negative stool  Extremities:   Extremities normal, atraumatic, no cyanosis or edema  Pulses:   2+ and symmetric all extremities  Skin:   Skin color, texture, turgor normal, no rashes or lesions  Lymph nodes:   Cervical, supraclavicular, and axillary nodes normal  Neurologic:   CNII-XII intact. Normal strength, sensation and reflexes      Throughout. He was unable to do [place]berg since he felt he was going to fall, could not walk heel to toe, but able to walk on heels and tip toes, but seemed to have a hard time with his  R foot compared to this L.    EKG- sinus rhythm, WNL    Assessment And Plan:  1. Essential hypertension- stable - EKG 12-Lead- normal - CMP14 + Anion Gap - CBC no Diff - Lipid Profile  2. Routine medical exam- routine  FU 1 y  3. Screen for colon cancer- screen  4. Screening for prostate cancer- screen - PSA  5. Prediabetes- unknown status.  - Microalbumin / Creatinine Urine Ratio- could not void, so we will do this next week.  - Hemoglobin A1c   Zyheir Daft RODRIGUEZ-SOUTHWORTH, PA-C    THE PATIENT IS ENCOURAGED TO PRACTICE SOCIAL DISTANCING DUE TO THE COVID-19 PANDEMIC.

## 2018-10-11 NOTE — Progress Notes (Signed)
Subjective:   Joseph Olson is a 85 y.o. male who presents for Medicare Annual/Subsequent preventive examination.  Review of Systems:  n/a Cardiac Risk Factors include: advanced age (>66men, >48 women);diabetes mellitus;hypertension;male gender;sedentary lifestyle     Objective:    Vitals: BP 138/76 (BP Location: Left Arm, Patient Position: Sitting, Cuff Size: Normal)   Pulse 75   Temp 98.2 F (36.8 C) (Oral)   Ht 5' 6.2" (1.681 m)   Wt 176 lb 6.4 oz (80 kg)   BMI 28.30 kg/m   Body mass index is 28.3 kg/m.  Advanced Directives 10/11/2018 04/21/2017  Does Patient Have a Medical Advance Directive? Yes Yes  Type of Paramedic of [place];Living will [place];Living will  Does patient want to make changes to medical advance directive? - No - Patient declined  Copy of [place] in Chart? No - copy requested No - copy requested    Tobacco Social History   Tobacco Use  Smoking Status Current Every Day Smoker  . Packs/day: 0.50  Smokeless Tobacco Never Used     Ready to quit: No Counseling given: Not Answered   Clinical Intake:  Pre-visit preparation completed: Yes  Pain : No/denies pain     Nutritional Status: BMI 25 -29 Overweight Nutritional Risks: None Diabetes: Yes CBG done?: No Did pt. bring in CBG monitor from home?: No  How often do you need to have someone help you when you read instructions, pamphlets, or other written materials from your doctor or pharmacy?: 1 - Never What is the last grade level you completed in school?: 5th grade  Interpreter Needed?: No  Information entered by :: NAllen LPN  Past Medical History:  Diagnosis Date  . Diabetes mellitus without complication ([place])    History reviewed. No pertinent surgical history. History reviewed. No pertinent family history. Social History   Socioeconomic History  . Marital status: Married    Spouse name: Not on file  . Number of  children: Not on file  . Years of education: Not on file  . Highest education level: Not on file  Occupational History  . Occupation: retired  Scientific laboratory technician  . Financial resource strain: Not hard at all  . Food insecurity    Worry: Never true    Inability: Never true  . Transportation needs    Medical: No    Non-medical: No  Tobacco Use  . Smoking status: Current Every Day Smoker    Packs/day: 0.50  . Smokeless tobacco: Never Used  Substance and Sexual Activity  . Alcohol use: Yes    Alcohol/week: 2.0 standard drinks    Types: 2 Cans of beer per week    Frequency: Never  . Drug use: No  . Sexual activity: Not Currently  Lifestyle  . Physical activity    Days per week: 0 days    Minutes per session: 0 min  . Stress: Not at all  Relationships  . Social Herbalist on phone: Not on file    Gets together: Not on file    Attends religious service: Not on file    Active member of club or organization: Not on file    Attends meetings of clubs or organizations: Not on file    Relationship status: Not on file  Other Topics Concern  . Not on file  Social History Narrative  . Not on file    Outpatient Encounter Medications as of 10/11/2018  Medication Sig  .  atorvastatin (LIPITOR) 10 MG tablet Take 1 tablet (10 mg total) by mouth daily at 6 PM.  . hydrochlorothiazide (HYDRODIURIL) 12.5 MG tablet Take 1 tablet (12.5 mg total) by mouth daily.  . metFORMIN (GLUCOPHAGE-XR) 500 MG 24 hr tablet Take 1 tablet (500 mg total) by mouth daily.  . Omega-3 Fatty Acids (FISH OIL PO) Take 1 tablet by mouth daily.   No facility-administered encounter medications on file as of 10/11/2018.     Activities of Daily Living In your present state of health, do you have any difficulty performing the following activities: 10/11/2018  Hearing? N  Vision? N  Difficulty concentrating or making decisions? N  Walking or climbing stairs? N  Dressing or bathing? N  Doing errands, shopping? N   Preparing Food and eating ? N  Using the Toilet? N  In the past six months, have you accidently leaked urine? Y  Do you have problems with loss of bowel control? N  Managing your Medications? N  Managing your Finances? N  Housekeeping or managing your Housekeeping? N  Some recent data might be hidden    Patient Care Team: [name], FNP as PCP - General (General Practice)   Assessment:   This is a routine wellness examination for Joseph Olson.  Exercise Activities and Dietary recommendations Current Exercise Habits: The patient does not participate in regular exercise at present  Goals    . Patient Stated     10/11/2018, no goals       Fall Risk Fall Risk  10/11/2018  Falls in the past year? 0  Risk for fall due to : Medication side effect  Follow up Falls evaluation completed;Education provided;Falls prevention discussed   Is the patient's home free of loose throw rugs in walkways, pet beds, electrical cords, etc?   yes      Grab bars in the bathroom? no      Handrails on the stairs?   n/a      Adequate lighting?   yes  Timed Get Up and Go Performed:  n/a  Depression Screen PHQ 2/9 Scores 10/11/2018  PHQ - 2 Score 0  PHQ- 9 Score 0    Cognitive Function     6CIT Screen 10/11/2018  What Year? 0 points  What month? 0 points  What time? 0 points  Count back from 20 0 points  Months in reverse 0 points  Repeat phrase 0 points  Total Score 0     There is no immunization history on file for this patient.  Qualifies for Shingles Vaccine? yes  Screening Tests Health Maintenance  Topic Date Due  . URINE MICROALBUMIN  08/31/1939  . TETANUS/TDAP  08/30/1948  . PNA vac Low Risk Adult (1 of 2 - PCV13) 08/31/1994  . INFLUENZA VACCINE  09/22/2018   Cancer Screenings: Lung: Low Dose CT Chest recommended if Age 73-80 years, 30 pack-year currently smoking OR have quit w/in 15years. Patient does not qualify. Colorectal: not required  Additional Screenings:   Hepatitis C Screening:n/a      Plan:    Patient has no goals set at this time.  I have personally reviewed and noted the following in the patient's chart:   . Medical and social history . Use of alcohol, tobacco or illicit drugs  . Current medications and supplements . Functional ability and status . Nutritional status . Physical activity . Advanced directives . List of other physicians . Hospitalizations, surgeries, and ER visits in previous 12 months . Vitals . Screenings  to include cognitive, depression, and falls . Referrals and appointments  In addition, I have reviewed and discussed with patient certain preventive protocols, quality metrics, and best practice recommendations. A written personalized care plan for preventive services as well as general preventive health recommendations were provided to patient.     Kellie Simmering, LPN  09/28/8108

## 2018-10-11 NOTE — Patient Instructions (Signed)
Mr. Joseph Olson , Thank you for taking time to come for your Medicare Wellness Visit. I appreciate your ongoing commitment to your health goals. Please review the following plan we discussed and let me know if I can assist you in the future.   Screening recommendations/referrals: Colonoscopy: not required Recommended yearly ophthalmology/optometry visit for glaucoma screening and checkup Recommended yearly dental visit for hygiene and checkup  Vaccinations: Influenza vaccine: decline Pneumococcal vaccine: decline Tdap vaccine: decline Shingles vaccine: discussed    Advanced directives: Please bring a copy of your POA (Power of Attorney) and/or Living Will to your next appointment.    Conditions/risks identified: overweight  Next appointment: 10/18/2018 at 10:45  Preventive Care 33 Years and Older, Male Preventive care refers to lifestyle choices and visits with your health care provider that can promote health and wellness. What does preventive care include?  A yearly physical exam. This is also called an annual well check.  Dental exams once or twice a year.  Routine eye exams. Ask your health care provider how often you should have your eyes checked.  Personal lifestyle choices, including:  Daily care of your teeth and gums.  Regular physical activity.  Eating a healthy diet.  Avoiding tobacco and drug use.  Limiting alcohol use.  Practicing safe sex.  Taking low doses of aspirin every day.  Taking vitamin and mineral supplements as recommended by your health care provider. What happens during an annual well check? The services and screenings done by your health care provider during your annual well check will depend on your age, overall health, lifestyle risk factors, and family history of disease. Counseling  Your health care provider may ask you questions about your:  Alcohol use.  Tobacco use.  Drug use.  Emotional well-being.  Home and relationship  well-being.  Sexual activity.  Eating habits.  History of falls.  Memory and ability to understand (cognition).  Work and work Statistician. Screening  You may have the following tests or measurements:  Height, weight, and BMI.  Blood pressure.  Lipid and cholesterol levels. These may be checked every 5 years, or more frequently if you are over 83 years old.  Skin check.  Lung cancer screening. You may have this screening every year starting at age 76 if you have a 30-pack-year history of smoking and currently smoke or have quit within the past 15 years.  Fecal occult blood test (FOBT) of the stool. You may have this test every year starting at age 83.  Flexible sigmoidoscopy or colonoscopy. You may have a sigmoidoscopy every 5 years or a colonoscopy every 10 years starting at age 83.  Prostate cancer screening. Recommendations will vary depending on your family history and other risks.  Hepatitis C blood test.  Hepatitis B blood test.  Sexually transmitted disease (STD) testing.  Diabetes screening. This is done by checking your blood sugar (glucose) after you have not eaten for a while (fasting). You may have this done every 1-3 years.  Abdominal aortic aneurysm (AAA) screening. You may need this if you are a current or former smoker.  Osteoporosis. You may be screened starting at age 83 if you are at high risk. Talk with your health care provider about your test results, treatment options, and if necessary, the need for more tests. Vaccines  Your health care provider may recommend certain vaccines, such as:  Influenza vaccine. This is recommended every year.  Tetanus, diphtheria, and acellular pertussis (Tdap, Td) vaccine. You may need a Td booster  every 10 years.  Zoster vaccine. You may need this after age 59.  Pneumococcal 13-valent conjugate (PCV13) vaccine. One dose is recommended after age 19.  Pneumococcal polysaccharide (PPSV23) vaccine. One dose is  recommended after age 17. Talk to your health care provider about which screenings and vaccines you need and how often you need them. This information is not intended to replace advice given to you by your health care provider. Make sure you discuss any questions you have with your health care provider. Document Released: 03/06/2015 Document Revised: 10/28/2015 Document Reviewed: 12/09/2014 Elsevier Interactive Patient Education  2017 [place] Prevention in the Home Falls can cause injuries. They can happen to people of all ages. There are many things you can do to make your home safe and to help prevent falls. What can I do on the outside of my home?  Regularly fix the edges of walkways and driveways and fix any cracks.  Remove anything that might make you trip as you walk through a door, such as a raised step or threshold.  Trim any bushes or trees on the path to your home.  Use bright outdoor lighting.  Clear any walking paths of anything that might make someone trip, such as rocks or tools.  Regularly check to see if handrails are loose or broken. Make sure that both sides of any steps have handrails.  Any raised decks and porches should have guardrails on the edges.  Have any leaves, snow, or ice cleared regularly.  Use sand or salt on walking paths during winter.  Clean up any spills in your garage right away. This includes oil or grease spills. What can I do in the bathroom?  Use night lights.  Install grab bars by the toilet and in the tub and shower. Do not use towel bars as grab bars.  Use non-skid mats or decals in the tub or shower.  If you need to sit down in the shower, use a plastic, non-slip stool.  Keep the floor dry. Clean up any water that spills on the floor as soon as it happens.  Remove soap buildup in the tub or shower regularly.  Attach bath mats securely with double-sided non-slip rug tape.  Do not have throw rugs and other things on  the floor that can make you trip. What can I do in the bedroom?  Use night lights.  Make sure that you have a light by your bed that is easy to reach.  Do not use any sheets or blankets that are too big for your bed. They should not hang down onto the floor.  Have a firm chair that has side arms. You can use this for support while you get dressed.  Do not have throw rugs and other things on the floor that can make you trip. What can I do in the kitchen?  Clean up any spills right away.  Avoid walking on wet floors.  Keep items that you use a lot in easy-to-reach places.  If you need to reach something above you, use a strong step stool that has a grab bar.  Keep electrical cords out of the way.  Do not use floor polish or wax that makes floors slippery. If you must use wax, use non-skid floor wax.  Do not have throw rugs and other things on the floor that can make you trip. What can I do with my stairs?  Do not leave any items on the stairs.  Make  sure that there are handrails on both sides of the stairs and use them. Fix handrails that are broken or loose. Make sure that handrails are as long as the stairways.  Check any carpeting to make sure that it is firmly attached to the stairs. Fix any carpet that is loose or worn.  Avoid having throw rugs at the top or bottom of the stairs. If you do have throw rugs, attach them to the floor with carpet tape.  Make sure that you have a light switch at the top of the stairs and the bottom of the stairs. If you do not have them, ask someone to add them for you. What else can I do to help prevent falls?  Wear shoes that:  Do not have high heels.  Have rubber bottoms.  Are comfortable and fit you well.  Are closed at the toe. Do not wear sandals.  If you use a stepladder:  Make sure that it is fully opened. Do not climb a closed stepladder.  Make sure that both sides of the stepladder are locked into place.  Ask someone to  hold it for you, if possible.  Clearly mark and make sure that you can see:  Any grab bars or handrails.  First and last steps.  Where the edge of each step is.  Use tools that help you move around (mobility aids) if they are needed. These include:  Canes.  Walkers.  Scooters.  Crutches.  Turn on the lights when you go into a dark area. Replace any light bulbs as soon as they burn out.  Set up your furniture so you have a clear path. Avoid moving your furniture around.  If any of your floors are uneven, fix them.  If there are any pets around you, be aware of where they are.  Review your medicines with your doctor. Some medicines can make you feel dizzy. This can increase your chance of falling. Ask your doctor what other things that you can do to help prevent falls. This information is not intended to replace advice given to you by your health care provider. Make sure you discuss any questions you have with your health care provider. Document Released: 12/04/2008 Document Revised: 07/16/2015 Document Reviewed: 03/14/2014 Elsevier Interactive Patient Education  2017 [employer].

## 2018-10-11 NOTE — Patient Instructions (Signed)
Preventive Care 83 Years and Older, Male Preventive care refers to lifestyle choices and visits with your health care provider that can promote health and wellness. This includes:  A yearly physical exam. This is also called an annual well check.  Regular dental and eye exams.  Immunizations.  Screening for certain conditions.  Healthy lifestyle choices, such as diet and exercise. What can I expect for my preventive care visit? Physical exam Your health care provider will check:  Height and weight. These may be used to calculate body mass index (BMI), which is a measurement that tells if you are at a healthy weight.  Heart rate and blood pressure.  Your skin for abnormal spots. Counseling Your health care provider may ask you questions about:  Alcohol, tobacco, and drug use.  Emotional well-being.  Home and relationship well-being.  Sexual activity.  Eating habits.  History of falls.  Memory and ability to understand (cognition).  Work and work Statistician. What immunizations do I need?  Influenza (flu) vaccine  This is recommended every year. Tetanus, diphtheria, and pertussis (Tdap) vaccine  You may need a Td booster every 10 years. Varicella (chickenpox) vaccine  You may need this vaccine if you have not already been vaccinated. Zoster (shingles) vaccine  You may need this after age 50. Pneumococcal conjugate (PCV13) vaccine  One dose is recommended after age 24. Pneumococcal polysaccharide (PPSV23) vaccine  One dose is recommended after age 33. Measles, mumps, and rubella (MMR) vaccine  You may need at least one dose of MMR if you were born in 1957 or later. You may also need a second dose. Meningococcal conjugate (MenACWY) vaccine  You may need this if you have certain conditions. Hepatitis A vaccine  You may need this if you have certain conditions or if you travel or work in places where you may be exposed to hepatitis A. Hepatitis B vaccine   You may need this if you have certain conditions or if you travel or work in places where you may be exposed to hepatitis B. Haemophilus influenzae type b (Hib) vaccine  You may need this if you have certain conditions. You may receive vaccines as individual doses or as more than one vaccine together in one shot (combination vaccines). Talk with your health care provider about the risks and benefits of combination vaccines. What tests do I need? Blood tests  Lipid and cholesterol levels. These may be checked every 5 years, or more frequently depending on your overall health.  Hepatitis C test.  Hepatitis B test. Screening  Lung cancer screening. You may have this screening every year starting at age 83 if you have a 30-pack-year history of smoking and currently smoke or have quit within the past 15 years.  Colorectal cancer screening. All adults should have this screening starting at age 83 and continuing until age 83. Your health care provider may recommend screening at age 83 if you are at increased risk. You will have tests every 1-10 years, depending on your results and the type of screening test.  Prostate cancer screening. Recommendations will vary depending on your family history and other risks.  Diabetes screening. This is done by checking your blood sugar (glucose) after you have not eaten for a while (fasting). You may have this done every 1-3 years.  Abdominal aortic aneurysm (AAA) screening. You may need this if you are a current or former smoker.  Sexually transmitted disease (STD) testing. Follow these instructions at home: Eating and drinking  Eat  a diet that includes fresh fruits and vegetables, whole grains, lean protein, and low-fat dairy products. Limit your intake of foods with high amounts of sugar, saturated fats, and salt.  Take vitamin and mineral supplements as recommended by your health care provider.  Do not drink alcohol if your health care provider  tells you not to drink.  If you drink alcohol: ? Limit how much you have to 0-2 drinks a day. ? Be aware of how much alcohol is in your drink. In the [place] one drink equals one 12 oz bottle of beer (355 mL), one 5 oz glass of wine (148 mL), or one 1 oz glass of hard liquor (44 mL). Lifestyle  Take daily care of your teeth and gums.  Stay active. Exercise for at least 30 minutes on 5 or more days each week.  Do not use any products that contain nicotine or tobacco, such as cigarettes, e-cigarettes, and chewing tobacco. If you need help quitting, ask your health care provider.  If you are sexually active, practice safe sex. Use a condom or other form of protection to prevent STIs (sexually transmitted infections).  Talk with your health care provider about taking a low-dose aspirin or statin. What's next?  Visit your health care provider once a year for a well check visit.  Ask your health care provider how often you should have your eyes and teeth checked.  Stay up to date on all vaccines. This information is not intended to replace advice given to you by your health care provider. Make sure you discuss any questions you have with your health care provider. Document Released: 03/06/2015 Document Revised: 02/01/2018 Document Reviewed: 02/01/2018 Elsevier Patient Education  2020 [employer]  

## 2018-10-12 LAB — CBC
Hematocrit: 36.2 % — ABNORMAL LOW (ref 37.5–51.0)
Hemoglobin: 12.3 g/dL — ABNORMAL LOW (ref 13.0–17.7)
MCH: 32.2 pg (ref 26.6–33.0)
MCHC: 34 g/dL (ref 31.5–35.7)
MCV: 95 fL (ref 79–97)
Platelets: 212 10*3/uL (ref 150–450)
RBC: 3.82 x10E6/uL — ABNORMAL LOW (ref 4.14–5.80)
RDW: 12.5 % (ref 11.6–15.4)
WBC: 5.5 10*3/uL (ref 3.4–10.8)

## 2018-10-12 LAB — CMP14 + ANION GAP
ALT: 9 IU/L (ref 0–44)
AST: 17 IU/L (ref 0–40)
Albumin/Globulin Ratio: 1.4 (ref 1.2–2.2)
Albumin: 4.2 g/dL (ref 3.6–4.6)
Alkaline Phosphatase: 69 IU/L (ref 39–117)
Anion Gap: 14 mmol/L (ref 10.0–18.0)
BUN/Creatinine Ratio: 12 (ref 10–24)
BUN: 12 mg/dL (ref 8–27)
Bilirubin Total: <0.2 mg/dL (ref 0.0–1.2)
CO2: 26 mmol/L (ref 20–29)
Calcium: 9.3 mg/dL (ref 8.6–10.2)
Chloride: 101 mmol/L (ref 96–106)
Creatinine, Ser: 0.99 mg/dL (ref 0.76–1.27)
GFR calc Af Amer: 78 mL/min/{1.73_m2} (ref >59)
GFR calc non Af Amer: 67 mL/min/{1.73_m2} (ref >59)
Globulin, Total: 2.9 g/dL (ref 1.5–4.5)
Glucose: 112 mg/dL — ABNORMAL HIGH (ref 65–99)
Potassium: 3.7 mmol/L (ref 3.5–5.2)
Sodium: 141 mmol/L (ref 134–144)
Total Protein: 7.1 g/dL (ref 6.0–8.5)

## 2018-10-12 LAB — PSA: Prostate Specific Ag, Serum: <0.1 ng/mL (ref 0.0–4.0)

## 2018-10-12 LAB — LIPID PANEL
Chol/HDL Ratio: 2 ratio (ref 0.0–5.0)
Cholesterol, Total: 132 mg/dL (ref 100–199)
HDL: 66 mg/dL (ref >39)
LDL Calculated: 50 mg/dL (ref 0–99)
Triglycerides: 78 mg/dL (ref 0–149)
VLDL Cholesterol Cal: 16 mg/dL (ref 5–40)

## 2018-10-12 LAB — HEMOGLOBIN A1C
Est. average glucose Bld gHb Est-mCnc: 128 mg/dL
Hgb A1c MFr Bld: 6.1 % — ABNORMAL HIGH (ref 4.8–5.6)

## 2018-10-18 ENCOUNTER — Ambulatory Visit (INDEPENDENT_AMBULATORY_CARE_PROVIDER_SITE_OTHER): Payer: Medicare HMO | Admitting: Internal Medicine

## 2018-10-18 ENCOUNTER — Encounter: Payer: Self-pay | Admitting: Internal Medicine

## 2018-10-18 ENCOUNTER — Other Ambulatory Visit: Payer: Self-pay

## 2018-10-18 VITALS — BP 136/72 | HR 78 | Temp 97.9°F | Ht 67.8 in | Wt 177.8 lb

## 2018-10-18 DIAGNOSIS — R7303 Prediabetes: Secondary | ICD-10-CM | POA: Diagnosis not present

## 2018-10-18 DIAGNOSIS — I10 Essential (primary) hypertension: Secondary | ICD-10-CM

## 2018-10-18 DIAGNOSIS — R6889 Other general symptoms and signs: Secondary | ICD-10-CM | POA: Diagnosis not present

## 2018-10-18 LAB — POCT URINALYSIS DIPSTICK
Bilirubin, UA: NEGATIVE
Blood, UA: NEGATIVE
Glucose, UA: NEGATIVE
Ketones, UA: NEGATIVE
Leukocytes, UA: NEGATIVE
Nitrite, UA: NEGATIVE
Protein, UA: NEGATIVE
Spec Grav, UA: 1.02 (ref 1.010–1.025)
Urobilinogen, UA: 0.2 E.U./dL
pH, UA: 5.5 (ref 5.0–8.0)

## 2018-10-18 LAB — POCT UA - MICROALBUMIN
Albumin/Creatinine Ratio, Urine, POC: 30
Creatinine, POC: 50 mg/dL
Microalbumin Ur, POC: 10 mg/L

## 2018-10-18 NOTE — Progress Notes (Signed)
  Subjective:     Patient ID: Joseph Olson , male    DOB: 10/31/1927 , 85 y.o.   MRN: [id number]   Chief Complaint  Patient presents with  . Follow-up    HPI  Pt is here for FU labs and give [place] a urine sample which he could not get to [place] last week. Has been doing well and denies having any complaints  Past Medical History:  Diagnosis Date  . Diabetes mellitus without complication (HCC)         Current Outpatient Medications:  .  atorvastatin (LIPITOR) 10 MG tablet, Take 1 tablet (10 mg total) by mouth daily at 6 PM., Disp: 90 tablet, Rfl: 1 .  hydrochlorothiazide (HYDRODIURIL) 12.5 MG tablet, Take 1 tablet (12.5 mg total) by mouth daily., Disp: 90 tablet, Rfl: 1 .  metFORMIN (GLUCOPHAGE-XR) 500 MG 24 hr tablet, Take 1 tablet (500 mg total) by mouth daily., Disp: 180 tablet, Rfl: 1 .  Omega-3 Fatty Acids (FISH OIL PO), Take 1 tablet by mouth daily., Disp: , Rfl:      Review of Systems  Denies dysuria, urgency, polydipsia and polydipsia, chest pains, SOB, fever, chills or sweats.  Today's Vitals   10/18/18 1058  BP: 136/72  Pulse: 78  Temp: 97.9 F (36.6 C)  TempSrc: Oral  SpO2: 98%  Weight: 177 lb 12.8 oz (80.6 kg)  Height: 5' 7.8" (1.722 m)   Body mass index is 27.19 kg/m.   Objective:  Physical Exam Vitals signs and nursing note reviewed.  Constitutional:      Appearance: Normal appearance. He is not toxic-appearing.  HENT:     Head: Normocephalic.     Right Ear: External ear normal.     Left Ear: External ear normal.  Eyes:     General: No scleral icterus.    Conjunctiva/sclera: Conjunctivae normal.  Cardiovascular:     Rate and Rhythm: Normal rate and regular rhythm.     Heart sounds: Normal heart sounds.  Pulmonary:     Effort: Pulmonary effort is normal.     Breath sounds: Normal breath sounds.  Musculoskeletal:     Comments: Normal gait  Skin:    General: Skin is dry.  Neurological:     Mental Status: He is alert and oriented to person, place,  and time.  Psychiatric:        Mood and Affect: Mood normal.        Behavior: Behavior normal.     UA microalbumin- neg    Assessment And Plan:    1. Essential hypertension- stable, may continue same meds  2. Prediabetes- a little worse, HGBAC is 6.1 and was 5.8.       Advised to cut down on sweets and sodas.    Marilena Trevathan RODRIGUEZ-SOUTHWORTH, PA-C    THE PATIENT IS ENCOURAGED TO PRACTICE SOCIAL DISTANCING DUE TO THE COVID-19 PANDEMIC.

## 2018-11-21 ENCOUNTER — Telehealth: Payer: Self-pay | Admitting: Nurse Practitioner

## 2018-11-21 NOTE — Chronic Care Management (AMB) (Signed)
Chronic Care Management   Note  11/21/2018 Name: Joseph Olson MRN: [id number] DOB: 13-Jan-1928  Joseph Olson is a 85 y.o. year old male who is a primary care patient of [name], [place]. I reached out to Evalina Field by phone today in response to a referral sent by Joseph Olson health plan.     Joseph Olson was given information about Chronic Care Management services today including:  1. CCM service includes personalized support from designated clinical staff supervised by his physician, including individualized plan of care and coordination with other care providers 2. 24/7 contact phone numbers for assistance for urgent and routine care needs. 3. Service will only be billed when office clinical staff spend 20 minutes or more in a month to coordinate care. 4. Only one practitioner may furnish and bill the service in a calendar month. 5. The patient may stop CCM services at any time (effective at the end of the month) by phone call to the office staff. 6. The patient will be responsible for cost sharing (co-pay) of up to 20% of the service fee (after annual deductible is met).  Patient agreed to services and verbal consent obtained.   Follow up plan: Telephone appointment with CCM team member scheduled for: 12/24/2018  [place]  ??[email or web address]   ??[phone]

## 2018-12-18 DIAGNOSIS — Z7984 Long term (current) use of oral hypoglycemic drugs: Secondary | ICD-10-CM | POA: Diagnosis not present

## 2018-12-18 DIAGNOSIS — H11159 Pinguecula, unspecified eye: Secondary | ICD-10-CM | POA: Diagnosis not present

## 2018-12-18 DIAGNOSIS — E119 Type 2 diabetes mellitus without complications: Secondary | ICD-10-CM | POA: Diagnosis not present

## 2018-12-18 DIAGNOSIS — Z961 Presence of intraocular lens: Secondary | ICD-10-CM | POA: Diagnosis not present

## 2018-12-18 DIAGNOSIS — I10 Essential (primary) hypertension: Secondary | ICD-10-CM | POA: Diagnosis not present

## 2018-12-18 DIAGNOSIS — H5213 Myopia, bilateral: Secondary | ICD-10-CM | POA: Diagnosis not present

## 2018-12-18 DIAGNOSIS — H18419 Arcus senilis, unspecified eye: Secondary | ICD-10-CM | POA: Diagnosis not present

## 2018-12-18 DIAGNOSIS — H524 Presbyopia: Secondary | ICD-10-CM | POA: Diagnosis not present

## 2018-12-18 LAB — HM DIABETES EYE EXAM

## 2018-12-24 ENCOUNTER — Telehealth: Payer: Medicare HMO

## 2018-12-24 ENCOUNTER — Other Ambulatory Visit: Payer: Self-pay | Admitting: Nurse Practitioner

## 2018-12-24 DIAGNOSIS — R7303 Prediabetes: Secondary | ICD-10-CM

## 2018-12-25 NOTE — Progress Notes (Signed)
This encounter was created in error - please disregard.

## 2018-12-26 NOTE — Progress Notes (Signed)
This encounter was created in error - please disregard.

## 2019-01-14 ENCOUNTER — Other Ambulatory Visit: Payer: Self-pay | Admitting: Internal Medicine

## 2019-01-14 ENCOUNTER — Encounter: Payer: Self-pay | Admitting: Internal Medicine

## 2019-01-14 DIAGNOSIS — I10 Essential (primary) hypertension: Secondary | ICD-10-CM

## 2019-01-22 ENCOUNTER — Ambulatory Visit (INDEPENDENT_AMBULATORY_CARE_PROVIDER_SITE_OTHER): Payer: Medicare HMO | Admitting: Nurse Practitioner

## 2019-01-22 ENCOUNTER — Encounter: Payer: Self-pay | Admitting: Nurse Practitioner

## 2019-01-22 ENCOUNTER — Other Ambulatory Visit: Payer: Self-pay

## 2019-01-22 VITALS — BP 132/70 | HR 72 | Temp 98.4°F | Ht 67.8 in | Wt 174.7 lb

## 2019-01-22 DIAGNOSIS — R202 Paresthesia of skin: Secondary | ICD-10-CM

## 2019-01-22 DIAGNOSIS — I10 Essential (primary) hypertension: Secondary | ICD-10-CM | POA: Diagnosis not present

## 2019-01-22 DIAGNOSIS — Z23 Encounter for immunization: Secondary | ICD-10-CM

## 2019-01-22 DIAGNOSIS — Z720 Tobacco use: Secondary | ICD-10-CM

## 2019-01-22 DIAGNOSIS — R7303 Prediabetes: Secondary | ICD-10-CM | POA: Diagnosis not present

## 2019-01-22 MED ORDER — DICLOFENAC SODIUM 1 % EX GEL: 2.0000 g | Freq: Four times a day (QID) | CUTANEOUS | 2 refills | Status: DC

## 2019-01-22 NOTE — Progress Notes (Signed)
Subjective:     Patient ID: Joseph Olson , male    DOB: 09-01-1927 , 85 y.o.   MRN: [id number]   Chief Complaint  Patient presents with  . Diabetes  . Hypertension    HPI  Hypertension This is a chronic problem. The current episode started more than 1 year ago. The problem is unchanged. The problem is controlled. Pertinent negatives include no anxiety, blurred vision, chest pain, headaches or palpitations. There are no associated agents to hypertension. There are no known risk factors for coronary artery disease. Past treatments include diuretics. The current treatment provides no improvement. There is no history of angina. There is no history of chronic renal disease.  Diabetes He presents for his follow-up diabetic visit. Diabetes type: prediabetes. His disease course has been stable. Pertinent negatives for hypoglycemia include no confusion, dizziness or headaches. Pertinent negatives for diabetes include no blurred vision, no chest pain, no polydipsia, no polyphagia and no polyuria. There are no hypoglycemic complications. Symptoms are stable. There are no diabetic complications. Risk factors for coronary artery disease include sedentary lifestyle and male sex.     Past Medical History:  Diagnosis Date  . Diabetes mellitus without complication ([place])      History reviewed. No pertinent family history.   Current Outpatient Medications:  .  atorvastatin (LIPITOR) 10 MG tablet, TAKE 1 TABLET (10 MG TOTAL) BY MOUTH DAILY AT 6 PM., Disp: 90 tablet, Rfl: 0 .  hydrochlorothiazide (HYDRODIURIL) 12.5 MG tablet, TAKE 1 TABLET (12.5 MG TOTAL) BY MOUTH DAILY., Disp: 90 tablet, Rfl: 1 .  metFORMIN (GLUCOPHAGE-XR) 500 MG 24 hr tablet, TAKE ONE TABLET BY MOUTH DAILY, Disp: 180 tablet, Rfl: 0 .  Omega-3 Fatty Acids (FISH OIL PO), Take 1 tablet by mouth daily., Disp: , Rfl:  .  diclofenac Sodium (VOLTAREN) 1 % GEL, Apply 2 g topically 4 (four) times daily., Disp: 100 g, Rfl: 2   No Known  Allergies   Review of Systems  Constitutional: Negative.   Eyes: Negative for blurred vision.  Respiratory: Negative.  Negative for cough.   Cardiovascular: Negative.  Negative for chest pain, palpitations and leg swelling.  Gastrointestinal: Negative.   Endocrine: Negative for polydipsia, polyphagia and polyuria.  Genitourinary: Negative.   Musculoskeletal: Negative.        Bilateral lower extremities with stinging.  Skin: Negative.   Neurological: Negative for dizziness and headaches.  Psychiatric/Behavioral: Negative for confusion.     Today's Vitals   01/22/19 0925  BP: 132/70  Pulse: 72  Temp: 98.4 F (36.9 C)  TempSrc: Oral  Weight: 174 lb 11.2 oz (79.2 kg)  Height: 5' 7.8" (1.722 m)  PainSc: 0-No pain   Body mass index is 26.72 kg/m.   Objective:  Physical Exam Vitals signs reviewed.  Constitutional:      Appearance: Normal appearance.  Cardiovascular:     Rate and Rhythm: Normal rate and regular rhythm.     Pulses: Normal pulses.     Heart sounds: Normal heart sounds. No murmur.  Pulmonary:     Effort: Pulmonary effort is normal. No respiratory distress.     Breath sounds: Normal breath sounds. No wheezing.  Musculoskeletal: Normal range of motion.        General: No swelling, tenderness or deformity.     Right lower leg: No edema.     Left lower leg: No edema.  Skin:    General: Skin is warm and dry.  Neurological:     General: No focal deficit  present.     Mental Status: He is alert and oriented to person, place, and time.  Psychiatric:        Mood and Affect: Mood normal.        Behavior: Behavior normal.        Thought Content: Thought content normal.        Judgment: Judgment normal.         Assessment And Plan:     1. Essential hypertension . B/P is well controlled.  . No labs this visit, kidney functions are normal.  . The importance of regular exercise and dietary modification was stressed to the patient.   2. Prediabetes  Chronic,  good control for his age  Continue to limit intake of sugary foods and drinks  3. Tingling of skin  Lower extremity tingling skin at times  This could be a neuropathy as his metabolic labs are normal. He is also a smoker as this can lead to vascular problems.    4. Need for influenza vaccination  Influenza vaccine given in office  Advised to take Tylenol as needed for muscle aches or fever - Flu vaccine HIGH DOSE PF (Fluzone High dose)  5. Tobacco abuse Ready to quit: No Counseling given: Yes Smoking cessation instruction/counseling given:  counseled patient on the dangers of tobacco use, advised patient to stop smoking, and reviewed strategies to maximize success         [name], FNP

## 2019-02-08 ENCOUNTER — Other Ambulatory Visit: Payer: Self-pay | Admitting: Internal Medicine

## 2019-02-08 DIAGNOSIS — R7303 Prediabetes: Secondary | ICD-10-CM

## 2019-02-16 ENCOUNTER — Other Ambulatory Visit: Payer: Self-pay | Admitting: Nurse Practitioner

## 2019-02-16 DIAGNOSIS — R202 Paresthesia of skin: Secondary | ICD-10-CM

## 2019-03-11 ENCOUNTER — Telehealth: Payer: Self-pay

## 2019-03-28 ENCOUNTER — Telehealth: Payer: Self-pay

## 2019-04-08 ENCOUNTER — Other Ambulatory Visit: Payer: Self-pay | Admitting: Nurse Practitioner

## 2019-04-08 DIAGNOSIS — R202 Paresthesia of skin: Secondary | ICD-10-CM

## 2019-04-22 ENCOUNTER — Ambulatory Visit (INDEPENDENT_AMBULATORY_CARE_PROVIDER_SITE_OTHER): Payer: Medicare HMO | Admitting: Nurse Practitioner

## 2019-04-22 ENCOUNTER — Other Ambulatory Visit: Payer: Self-pay

## 2019-04-22 ENCOUNTER — Encounter: Payer: Self-pay | Admitting: Nurse Practitioner

## 2019-04-22 VITALS — BP 124/70 | HR 62 | Temp 98.1°F | Wt 176.0 lb

## 2019-04-22 DIAGNOSIS — R7303 Prediabetes: Secondary | ICD-10-CM | POA: Diagnosis not present

## 2019-04-22 DIAGNOSIS — Z720 Tobacco use: Secondary | ICD-10-CM

## 2019-04-22 DIAGNOSIS — R202 Paresthesia of skin: Secondary | ICD-10-CM | POA: Diagnosis not present

## 2019-04-22 DIAGNOSIS — I10 Essential (primary) hypertension: Secondary | ICD-10-CM | POA: Diagnosis not present

## 2019-04-22 DIAGNOSIS — L608 Other nail disorders: Secondary | ICD-10-CM

## 2019-04-22 MED ORDER — METFORMIN HCL ER 500 MG PO TB24: 500.0000 mg | ORAL_TABLET | Freq: Every day | ORAL | 1 refills | Status: DC

## 2019-04-22 MED ORDER — ATORVASTATIN CALCIUM 10 MG PO TABS: 10.0000 mg | ORAL_TABLET | Freq: Every day | ORAL | 1 refills | Status: DC

## 2019-04-22 MED ORDER — CHANTIX STARTING MONTH PAK 0.5 MG X 11 & 1 MG X 42 PO TABS: ORAL_TABLET | ORAL | 0 refills | Status: DC

## 2019-04-22 MED ORDER — HYDROCHLOROTHIAZIDE 12.5 MG PO TABS: 12.5000 mg | ORAL_TABLET | Freq: Every day | ORAL | 1 refills | Status: DC

## 2019-04-22 NOTE — Progress Notes (Signed)
Subjective:     Patient ID: Joseph Olson , male    DOB: [birth year] , 86 y.o.   MRN: [id number]   Chief Complaint  Patient presents with  . Hypertension    HPI  He is interested in quitting smoking has tried the nicotine gum but will "spit it out" then smoke.  Continues to have intermittent tingling to his lower extremities  Diabetes He presents for his follow-up diabetic visit. Diabetes type: prediabetes. His disease course has been stable. Pertinent negatives for hypoglycemia include no confusion, dizziness or headaches. Pertinent negatives for diabetes include no blurred vision, no chest pain, no polydipsia, no polyphagia and no polyuria. There are no hypoglycemic complications. Symptoms are stable. There are no diabetic complications. Risk factors for coronary artery disease include sedentary lifestyle and male sex.  Hypertension This is a chronic problem. The current episode started more than 1 year ago. The problem is unchanged. The problem is controlled. Pertinent negatives include no anxiety, blurred vision, chest pain, headaches or palpitations. There are no associated agents to hypertension. There are no known risk factors for coronary artery disease. Past treatments include diuretics. The current treatment provides no improvement. There is no history of angina. There is no history of chronic renal disease.     Past Medical History:  Diagnosis Date  . Diabetes mellitus without complication ([place])      History reviewed. No pertinent family history.   Current Outpatient Medications:  .  atorvastatin (LIPITOR) 10 MG tablet, Take 1 tablet (10 mg total) by mouth daily at 6 PM., Disp: 90 tablet, Rfl: 1 .  diclofenac Sodium (VOLTAREN) 1 % GEL, APPLY 2 GRAMS TOPICALLY 4 TIMES DAILY., Disp: 600 g, Rfl: 1 .  hydrochlorothiazide (HYDRODIURIL) 12.5 MG tablet, Take 1 tablet (12.5 mg total) by mouth daily., Disp: 90 tablet, Rfl: 1 .  metFORMIN (GLUCOPHAGE-XR) 500 MG 24 hr tablet, Take 1  tablet (500 mg total) by mouth daily., Disp: 90 tablet, Rfl: 1 .  Omega-3 Fatty Acids (FISH OIL PO), Take 1 tablet by mouth daily., Disp: , Rfl:  .  varenicline (CHANTIX STARTING MONTH PAK) 0.5 MG X 11 & 1 MG X 42 tablet, Take one 0.5 mg tablet by mouth once daily for 3 days, then increase to one 0.5 mg tablet twice daily for 4 days, then increase to one 1 mg tablet twice daily., Disp: 53 tablet, Rfl: 0   No Known Allergies   Review of Systems  Constitutional: Negative.   Eyes: Negative for blurred vision.  Respiratory: Negative.  Negative for cough.   Cardiovascular: Negative.  Negative for chest pain, palpitations and leg swelling.  Gastrointestinal: Negative.   Endocrine: Negative for polydipsia, polyphagia and polyuria.  Genitourinary: Negative.   Musculoskeletal: Negative.        Bilateral lower extremities with stinging.  Skin: Negative.   Neurological: Negative for dizziness and headaches.  Psychiatric/Behavioral: Negative for confusion.     Today's Vitals   04/22/19 0843  BP: 124/70  Pulse: 62  Temp: 98.1 F (36.7 C)  TempSrc: Oral  Weight: 176 lb (79.8 kg)  PainSc: 0-No pain   Body mass index is 26.92 kg/m.   Objective:  Physical Exam Vitals reviewed.  Constitutional:      Appearance: Normal appearance.  Cardiovascular:     Rate and Rhythm: Normal rate and regular rhythm.     Pulses: Normal pulses.     Heart sounds: Normal heart sounds. No murmur.  Pulmonary:     Effort: Pulmonary effort  is normal. No respiratory distress.     Breath sounds: Normal breath sounds. No wheezing.  Musculoskeletal:        General: No swelling, tenderness or deformity. Normal range of motion.     Right lower leg: No edema.     Left lower leg: No edema.  Skin:    General: Skin is warm and dry.     Comments: Toenails on bilateral feet are thickened and long  Also has shiny skin to lower extremities with no hair present  Neurological:     General: No focal deficit present.      Mental Status: He is alert and oriented to person, place, and time.  Psychiatric:        Mood and Affect: Mood normal.        Behavior: Behavior normal.        Thought Content: Thought content normal.        Judgment: Judgment normal.         Assessment And Plan:     1. Prediabetes  Chronic, stable  Continue with current medications  Encouraged to limit intake of sugary foods and drinks  Encouraged to increase physical activity to 150 minutes per week as tolerated to include chair exercises - Hemoglobin A1c - Lipid panel  2. Essential hypertension B/P is controlled.  CMP ordered to check renal function.  The importance of regular exercise and dietary modification was stressed to the patient.  Stressed importance of losing ten percent of her body weight to help with B/P control. - CMP14+EGFR - Hemoglobin A1c  3. Tingling of skin  Likely related to his poor circulation   Will have him to take vitamin B supplement daily   4. Tobacco abuse  Smoking cessation instruction/counseling given:  counseled patient on the dangers of tobacco use, advised patient to stop smoking, and reviewed strategies to maximize success Ready to quit: Yes Counseling given: Yes                [name], FNP

## 2019-04-22 NOTE — Patient Instructions (Signed)
Steps to Quit Smoking Smoking tobacco is the leading cause of preventable death. It can affect almost every organ in the body. Smoking puts you and people around you at risk for many serious, long-lasting (chronic) diseases. Quitting smoking can be hard, but it is one of the best things that you can do for your health. It is never too late to quit. How do I get ready to quit? When you decide to quit smoking, make a plan to help you succeed. Before you quit:  Pick a date to quit. Set a date within the next 2 weeks to give you time to prepare.  Write down the reasons why you are quitting. Keep this list in places where you will see it often.  Tell your family, friends, and co-workers that you are quitting. Their support is important.  Talk with your doctor about the choices that may help you quit.  Find out if your health insurance will pay for these treatments.  Know the people, places, things, and activities that make you want to smoke (triggers). Avoid them. What first steps can I take to quit smoking?  Throw away all cigarettes at home, at work, and in your car.  Throw away the things that you use when you smoke, such as ashtrays and lighters.  Clean your car. Make sure to empty the ashtray.  Clean your home, including curtains and carpets. What can I do to help me quit smoking? Talk with your doctor about taking medicines and seeing a counselor at the same time. You are more likely to succeed when you do both.  If you are pregnant or breastfeeding, talk with your doctor about counseling or other ways to quit smoking. Do not take medicine to help you quit smoking unless your doctor tells you to do so. To quit smoking: Quit right away  Quit smoking totally, instead of slowly cutting back on how much you smoke over a period of time.  Go to counseling. You are more likely to quit if you go to counseling sessions regularly. Take medicine You may take medicines to help you quit. Some  medicines need a prescription, and some you can buy over-the-counter. Some medicines may contain a drug called nicotine to replace the nicotine in cigarettes. Medicines may:  Help you to stop having the desire to smoke (cravings).  Help to stop the problems that come when you stop smoking (withdrawal symptoms). Your doctor may ask you to use:  Nicotine patches, gum, or lozenges.  Nicotine inhalers or sprays.  Non-nicotine medicine that is taken by mouth. Find resources Find resources and other ways to help you quit smoking and remain smoke-free after you quit. These resources are most helpful when you use them often. They include:  Online chats with a counselor.  Phone quitlines.  Printed self-help materials.  Support groups or group counseling.  Text messaging programs.  Mobile phone apps. Use apps on your mobile phone or tablet that can help you stick to your quit plan. There are many free apps for mobile phones and tablets as well as websites. Examples include Quit Guide from the CDC and smokefree.gov  What things can I do to make it easier to quit?   Talk to your family and friends. Ask them to support and encourage you.  Call a phone quitline (1-800-QUIT-NOW), reach out to support groups, or work with a counselor.  Ask people who smoke to not smoke around you.  Avoid places that make you want to smoke,   such as: ? Bars. ? Parties. ? Smoke-break areas at work.  Spend time with people who do not smoke.  Lower the stress in your life. Stress can make you want to smoke. Try these things to help your stress: ? Getting regular exercise. ? Doing deep-breathing exercises. ? Doing yoga. ? Meditating. ? Doing a body scan. To do this, close your eyes, focus on one area of your body at a time from head to toe. Notice which parts of your body are tense. Try to relax the muscles in those areas. How will I feel when I quit smoking? Day 1 to 3 weeks Within the first 24 hours,  you may start to have some problems that come from quitting tobacco. These problems are very bad 2-3 days after you quit, but they do not often last for more than 2-3 weeks. You may get these symptoms:  Mood swings.  Feeling restless, nervous, angry, or annoyed.  Trouble concentrating.  Dizziness.  Strong desire for high-sugar foods and nicotine.  Weight gain.  Trouble pooping (constipation).  Feeling like you may vomit (nausea).  Coughing or a sore throat.  Changes in how the medicines that you take for other issues work in your body.  Depression.  Trouble sleeping (insomnia). Week 3 and afterward After the first 2-3 weeks of quitting, you may start to notice more positive results, such as:  Better sense of smell and taste.  Less coughing and sore throat.  Slower heart rate.  Lower blood pressure.  Clearer skin.  Better breathing.  Fewer sick days. Quitting smoking can be hard. Do not give up if you fail the first time. Some people need to try a few times before they succeed. Do your best to stick to your quit plan, and talk with your doctor if you have any questions or concerns. Summary  Smoking tobacco is the leading cause of preventable death. Quitting smoking can be hard, but it is one of the best things that you can do for your health.  When you decide to quit smoking, make a plan to help you succeed.  Quit smoking right away, not slowly over a period of time.  When you start quitting, seek help from your doctor, family, or friends. This information is not intended to replace advice given to you by your health care provider. Make sure you discuss any questions you have with your health care provider. Document Revised: 11/02/2018 Document Reviewed: 04/28/2018 Elsevier Patient Education  2020 [employer] Inc.  

## 2019-04-23 LAB — CMP14+EGFR
ALT: 5 IU/L (ref 0–44)
AST: 11 IU/L (ref 0–40)
Albumin/Globulin Ratio: 1.5 (ref 1.2–2.2)
Albumin: 4.1 g/dL (ref 3.6–4.6)
Alkaline Phosphatase: 76 IU/L (ref 39–117)
BUN/Creatinine Ratio: 16 (ref 10–24)
BUN: 15 mg/dL (ref 8–27)
Bilirubin Total: 0.2 mg/dL (ref 0.0–1.2)
CO2: 22 mmol/L (ref 20–29)
Calcium: 9.2 mg/dL (ref 8.6–10.2)
Chloride: 104 mmol/L (ref 96–106)
Creatinine, Ser: 0.95 mg/dL (ref 0.76–1.27)
GFR calc Af Amer: 82 mL/min/{1.73_m2} (ref >59)
GFR calc non Af Amer: 71 mL/min/{1.73_m2} (ref >59)
Globulin, Total: 2.8 g/dL (ref 1.5–4.5)
Glucose: 95 mg/dL (ref 65–99)
Potassium: 4 mmol/L (ref 3.5–5.2)
Sodium: 142 mmol/L (ref 134–144)
Total Protein: 6.9 g/dL (ref 6.0–8.5)

## 2019-04-23 LAB — LIPID PANEL
Chol/HDL Ratio: 2.3 ratio (ref 0.0–5.0)
Cholesterol, Total: 129 mg/dL (ref 100–199)
HDL: 55 mg/dL (ref >39)
LDL Chol Calc (NIH): 63 mg/dL (ref 0–99)
Triglycerides: 50 mg/dL (ref 0–149)
VLDL Cholesterol Cal: 11 mg/dL (ref 5–40)

## 2019-04-23 LAB — HEMOGLOBIN A1C
Est. average glucose Bld gHb Est-mCnc: 120 mg/dL
Hgb A1c MFr Bld: 5.8 % — ABNORMAL HIGH (ref 4.8–5.6)

## 2019-05-14 ENCOUNTER — Ambulatory Visit: Payer: Medicare HMO | Admitting: Podiatry

## 2019-05-14 ENCOUNTER — Encounter: Payer: Self-pay | Admitting: Podiatry

## 2019-05-14 ENCOUNTER — Other Ambulatory Visit: Payer: Self-pay

## 2019-05-14 VITALS — Temp 97.0°F | Resp 14

## 2019-05-14 DIAGNOSIS — B351 Tinea unguium: Secondary | ICD-10-CM | POA: Diagnosis not present

## 2019-05-14 DIAGNOSIS — M79675 Pain in left toe(s): Secondary | ICD-10-CM

## 2019-05-14 DIAGNOSIS — M79674 Pain in right toe(s): Secondary | ICD-10-CM | POA: Diagnosis not present

## 2019-05-14 DIAGNOSIS — G629 Polyneuropathy, unspecified: Secondary | ICD-10-CM | POA: Diagnosis not present

## 2019-05-14 MED ORDER — GABAPENTIN 100 MG PO CAPS: 100.0000 mg | ORAL_CAPSULE | Freq: Every day | ORAL | 0 refills | Status: DC

## 2019-05-14 NOTE — Patient Instructions (Signed)

## 2019-05-15 NOTE — Progress Notes (Signed)
Subjective:   Patient ID: Joseph Olson, male   DOB: 86 y.o.   MRN: [id number]   HPI 86 year old male presents the office today for concerns of thick, discolored toenails that he cannot trim himself.  They are causing irritation when shoes.  He also gets a stinging sensation to his knees down which is aggravating.  He has tried Voltaren gel but any improvement.  It does keep him up at night.  Last A1c was 5.8.  He takes Metformin.  Review of Systems  All other systems reviewed and are negative.  Past Medical History:  Diagnosis Date  . Diabetes mellitus without complication ([place])     History reviewed. No pertinent surgical history.   Current Outpatient Medications:  .  atorvastatin (LIPITOR) 10 MG tablet, Take 1 tablet (10 mg total) by mouth daily at 6 PM., Disp: 90 tablet, Rfl: 1 .  diclofenac Sodium (VOLTAREN) 1 % GEL, APPLY 2 GRAMS TOPICALLY 4 TIMES DAILY., Disp: 600 g, Rfl: 1 .  gabapentin (NEURONTIN) 100 MG capsule, Take 1 capsule (100 mg total) by mouth at bedtime., Disp: 90 capsule, Rfl: 0 .  hydrochlorothiazide (HYDRODIURIL) 12.5 MG tablet, Take 1 tablet (12.5 mg total) by mouth daily., Disp: 90 tablet, Rfl: 1 .  metFORMIN (GLUCOPHAGE-XR) 500 MG 24 hr tablet, Take 1 tablet (500 mg total) by mouth daily., Disp: 90 tablet, Rfl: 1 .  Omega-3 Fatty Acids (FISH OIL PO), Take 1 tablet by mouth daily., Disp: , Rfl:  .  varenicline (CHANTIX STARTING MONTH PAK) 0.5 MG X 11 & 1 MG X 42 tablet, Take one 0.5 mg tablet by mouth once daily for 3 days, then increase to one 0.5 mg tablet twice daily for 4 days, then increase to one 1 mg tablet twice daily., Disp: 53 tablet, Rfl: 0  No Known Allergies       Objective:  Physical Exam  General: AAO x3, NAD  Dermatological: Nails are hypertrophic, dystrophic, brittle, discolored, elongated 10. No surrounding redness or drainage. Tenderness nails 1-5 bilaterally. No open lesions or pre-ulcerative lesions are identified today.  Vascular:  Dorsalis Pedis artery and Posterior Tibial artery pedal pulses are 2/4 bilateral with immedate capillary fill time.There is no pain with calf compression, swelling, warmth, erythema.   Neruologic:  Sensation grossly intact with Semmes Weinstein monofilament however describing burning, tingling sensation to his lower extremities and the knee down.  Musculoskeletal: No gross boney pedal deformities bilateral.       Assessment:   Symptomatic onychomycosis, likely neuropathy    Plan:  -Treatment options discussed including all alternatives, risks, and complications -Etiology of symptoms were discussed -Nails debrided x10 without any complications or bleeding -Discussed other treatment options for the stinging sensation.  We will start gabapentin 1 mg at nighttime discussed side effects.  He does live with his 2 sons.  Monitor for side effects.  Return in about 2 months (around 07/14/2019).  Trula Slade DPM

## 2019-05-16 ENCOUNTER — Ambulatory Visit (INDEPENDENT_AMBULATORY_CARE_PROVIDER_SITE_OTHER): Payer: Medicare HMO

## 2019-05-16 ENCOUNTER — Telehealth: Payer: Self-pay

## 2019-05-16 ENCOUNTER — Other Ambulatory Visit: Payer: Self-pay

## 2019-05-16 DIAGNOSIS — I10 Essential (primary) hypertension: Secondary | ICD-10-CM

## 2019-05-16 DIAGNOSIS — R7303 Prediabetes: Secondary | ICD-10-CM

## 2019-05-20 NOTE — Patient Instructions (Signed)
Visit Information  Goals Addressed      Patient Stated   . "I would like for my legs to feel better" (pt-stated)       [place] (see longtitudinal plan of care for additional care plan information)  Current Barriers:  [name] Knowledge Deficits related to evaluation and treatment for bilateral leg cramps/pain related to Neuropathy . Chronic Disease Management support and education needs related to Essential HTN, Prediabetes  Nurse Case Manager Clinical Goal(s):  [name] Over the next 90 days, patient will work with CCM RN CM and PCP to address needs related to disease education and support to improve Self Health management of Neuropathy  CCM RN CM Interventions:  05/17/19 completed call with patient  . Evaluation of current treatment plan related to Neuropathy and patient's adherence to plan as established by provider. . Provided education to patient re: basic disease process and treatment for Neuropathy, discussed what to monitor for and when to call the doctor if needed . Reviewed medications with patient and discussed patient is waiting to pick up Rx for Gabapentin 100 mg at bedtime; reviewed and discussed dosage, frequency and indication, educated on potential SE to monitor for and report if bothersome . Discussed plans with patient for ongoing care management follow up and provided patient with direct contact information for care management team  Patient Self Care Activities:  . Patient verbalizes understanding of plan to start Gabapentin 100 mg at bedtime for Neuropathy pain as soon as ready at pharmacy . Self administers medications as prescribed . Attends all scheduled provider appointments . Calls pharmacy for medication refills . Performs ADL's independently . Performs IADL's independently . Calls provider office for new concerns or questions  Initial goal documentation     . "I would like to get help signing up for the COVID 19 Vaccine" (pt-stated)       CARE PLAN  ENTRY (see longtitudinal plan of care for additional care plan information)  Current Barriers:  [name] Knowledge Deficits related to how and where to receive COVID 19 vaccine . Chronic Disease Management support and education needs related to Essential HTN, Prediabetes  Nurse Case Manager Clinical Goal(s):  [name] Over the next 30 days, patient will verbalize understanding of plan for when and where to receive his COVID 19 vaccines  ICCM RN CM Interventions:  05/17/19 call completed with patient  . Determined patient needs assistance with scheduling his COVID 19 vaccines . Determined patient prefers to go to [facility] near his home . Discussed his son is available to drive him on Tuesdays, confirmed with son Legrand Como  . Completed on line registration for patient's 1st and 2nd COVID 19 vaccines, spoke with son Legrand Como and advised he will receive an email confirmation with the confirmation, date, time and location for Mr. Milby's COVID 19 vaccines . Advised patient to go to the [facility] [address] [place], [place] on Tuesday, March 30 @ 9:30 AM for his first COVID vaccine; Advised patient he should receive his 2nd COVID vaccine at the same location on 06/18/19 @9 :15 AM; Advised patient this information will be sent to his son's email address used for registration as well, pt verbalizes understanding  . Discussed plans with patient for ongoing care management follow up and provided patient with direct contact information for care management team  Patient Self Care Activities:  . Patient verbalizes understanding of plan to receive his 1st and 2nd COVID 19 vaccines, pt aware of time,date and location  . Self administers  medications as prescribed . Attends all scheduled provider appointments . Calls pharmacy for medication refills . Performs ADL's independently . Performs IADL's independently . Calls provider office for new concerns or questions  Initial goal documentation     . "to keep  blood sugar good" (pt-stated)       CARE PLAN ENTRY (see longtitudinal plan of care for additional care plan information)  Current Barriers:  [name] Knowledge Deficits related to disease process and Self Health management of DM . Chronic Disease Management support and education needs related to Prediabetes, Essential Hypertension   Nurse Case Manager Clinical Goal(s):  [name] Over the next 90 days, patient will work with CCM RN CM and PCP to address needs related to disease education and support for improved Self Health management of Prediabetes  CCM RN CM Interventions:  05/17/19 call completed with patient  . Evaluation of current treatment plan related to Prediabetes and patient's adherence to plan as established by provider. . Provided education to patient re: current A1c; educated on target ranges and how to maintain and or lower current A1c with medication and diet adherence, staying active and implementing daily exercise regimen, discussed 150 minutes per week as tolerated . Reviewed medications with patient and discussed indication, frequency and dosage or prescribed Metformin 500 mg XR daily with breakfast . Discussed plans with patient for ongoing care management follow up and provided patient with direct contact information for care management team . Provided patient with printed educational materials related to Diabetes Management with Meal Planning; Carb Choices; signs/symptoms of Hypoglycemia; Life's Simple 7  Patient Self Care Activities:  . Self administers medications as prescribed . Attends all scheduled provider appointments . Calls pharmacy for medication refills . Performs ADL's independently . Performs IADL's independently . Calls provider office for new concerns or questions  Initial goal documentation     . "to keep my BP under good control" (pt-stated)       CARE PLAN ENTRY (see longtitudinal plan of care for additional care plan information)  Current Barriers:   [name] Knowledge Deficits related to disease process and Self Health management of HTN . Chronic Disease Management support and education needs related to Essential Hypertension and Prediabetes  Nurse Case Manager Clinical Goal(s):  [name] Over the next 90 days, patient will work with CCM RN CM and PCP to address needs related to disease education and support to improve Self Health management of HTN  CCM RN CM Interventions:  05/17/19 call completed with patient  . Evaluation of current treatment plan related to Essential HTN and patient's adherence to plan as established by provider. . Provided education to patient re: target BP <130/80; basic disease education with rationale on importance of controlling BP to help reduce associated complications . Reviewed medications with patient and discussed importance of medication adherence; reviewed medication indication, dosage and frequency of prescribed medications . Discussed plans with patient for ongoing care management follow up and provided patient with direct contact information for care management team . Provided patient with printed educational materials related to What is High Blood Pressure?; African Americans and High Blood Pressure; Why Should I Restrict Sodium; High Blood Pressure and Stroke  Patient Self Care Activities:  . Self administers medications as prescribed . Attends all scheduled provider appointments . Calls pharmacy for medication refills . Performs ADL's independently . Performs IADL's independently . Calls provider office for new concerns or questions  Initial goal documentation        Patient verbalizes understanding of  instructions provided today.   Telephone follow up appointment with care management team member scheduled for:  06/14/19  Barb Merino, RN, BSN, CCM Care Management Coordinator [place] [facility]  Direct Phone: [phone]

## 2019-05-20 NOTE — Chronic Care Management (AMB) (Signed)
Chronic Care Management   Initial Visit Note  05/17/2019 Name: Joseph Olson MRN: [id number] DOB: September 20, 1927  Referred by: [name], FNP Reason for referral : Chronic Care Management (RQ Initial RN Call )   Joseph Olson is a 86 y.o. year old male who is a primary care patient of [name], [place]. The CCM team was consulted for assistance with chronic disease management and care coordination needs related to HTN and Prediabetes, Neuropathy  Review of patient status, including review of consultants reports, relevant laboratory and other test results, and collaboration with appropriate care team members and the patient's provider was performed as part of comprehensive patient evaluation and provision of chronic care management services.    SDOH (Social Determinants of Health) assessments performed: Yes - no needs identified See Care Plan activities for detailed interventions related to [place] initial CCM RN CM outbound call to patient to assess for CCM needs, a care plan was established.     Medications: Outpatient Encounter Medications as of 05/16/2019  Medication Sig  . atorvastatin (LIPITOR) 10 MG tablet Take 1 tablet (10 mg total) by mouth daily at 6 PM.  . diclofenac Sodium (VOLTAREN) 1 % GEL APPLY 2 GRAMS TOPICALLY 4 TIMES DAILY.  [name] gabapentin (NEURONTIN) 100 MG capsule Take 1 capsule (100 mg total) by mouth at bedtime.  . hydrochlorothiazide (HYDRODIURIL) 12.5 MG tablet Take 1 tablet (12.5 mg total) by mouth daily.  . metFORMIN (GLUCOPHAGE-XR) 500 MG 24 hr tablet Take 1 tablet (500 mg total) by mouth daily.  . Omega-3 Fatty Acids (FISH OIL PO) Take 1 tablet by mouth daily.  . varenicline (CHANTIX STARTING MONTH PAK) 0.5 MG X 11 & 1 MG X 42 tablet Take one 0.5 mg tablet by mouth once daily for 3 days, then increase to one 0.5 mg tablet twice daily for 4 days, then increase to one 1 mg tablet twice daily.   No facility-administered encounter medications on file as of  05/16/2019.     Objective:  Lab Results  Component Value Date   HGBA1C 5.8 (H) 04/22/2019   HGBA1C 6.1 (H) 10/11/2018   HGBA1C 5.8 (H) 04/30/2018   Lab Results  Component Value Date   MICROALBUR 10 10/18/2018   LDLCALC 63 04/22/2019   CREATININE 0.95 04/22/2019   BP Readings from Last 3 Encounters:  04/22/19 124/70  01/22/19 132/70  10/18/18 136/72    Goals Addressed      Patient Stated   . "I would like for my legs to feel better" (pt-stated)       [place] (see longtitudinal plan of care for additional care plan information)  Current Barriers:  [name] Knowledge Deficits related to evaluation and treatment for bilateral leg cramps/pain related to Neuropathy . Chronic Disease Management support and education needs related to Essential HTN, Prediabetes  Nurse Case Manager Clinical Goal(s):  [name] Over the next 90 days, patient will work with CCM RN CM and PCP to address needs related to disease education and support to improve Self Health management of Neuropathy  CCM RN CM Interventions:  05/17/19 completed call with patient  . Evaluation of current treatment plan related to Neuropathy and patient's adherence to plan as established by provider. . Provided education to patient re: basic disease process and treatment for Neuropathy, discussed what to monitor for and when to call the doctor if needed . Reviewed medications with patient and discussed patient is waiting to pick up Rx for Gabapentin 100 mg at bedtime;  reviewed and discussed dosage, frequency and indication, educated on potential SE to monitor for and report if bothersome . Discussed plans with patient for ongoing care management follow up and provided patient with direct contact information for care management team  Patient Self Care Activities:  . Patient verbalizes understanding of plan to start Gabapentin 100 mg at bedtime for Neuropathy pain as soon as ready at pharmacy . Self administers medications as  prescribed . Attends all scheduled provider appointments . Calls pharmacy for medication refills . Performs ADL's independently . Performs IADL's independently . Calls provider office for new concerns or questions  Initial goal documentation     . "I would like to get help signing up for the COVID 19 Vaccine" (pt-stated)       CARE PLAN ENTRY (see longtitudinal plan of care for additional care plan information)  Current Barriers:  [name] Knowledge Deficits related to how and where to receive COVID 19 vaccine . Chronic Disease Management support and education needs related to Essential HTN, Prediabetes  Nurse Case Manager Clinical Goal(s):  [name] Over the next 30 days, patient will verbalize understanding of plan for when and where to receive his COVID 19 vaccines  ICCM RN CM Interventions:  05/17/19 call completed with patient  . Determined patient needs assistance with scheduling his COVID 19 vaccines . Determined patient prefers to go to [facility] near his home . Discussed his son is available to drive him on Tuesdays, confirmed with son Joseph Olson  . Completed on line registration for patient's 1st and 2nd COVID 19 vaccines, spoke with son Joseph Olson and advised he will receive an email confirmation with the confirmation, date, time and location for Mr. [name]'s COVID 19 vaccines . Advised patient to go to the [facility] [address], [place], [place] on Tuesday, March 30 @ 9:30 AM for his first COVID vaccine; Advised patient he should receive his 2nd COVID vaccine at the same location on 06/18/19 @9 :15 AM; Advised patient this information will be sent to his son's email address used for registration as well, pt verbalizes understanding  . Discussed plans with patient for ongoing care management follow up and provided patient with direct contact information for care management team  Patient Self Care Activities:  . Patient verbalizes understanding of plan to receive his 1st and 2nd COVID 19  vaccines, pt aware of time,date and location  . Self administers medications as prescribed . Attends all scheduled provider appointments . Calls pharmacy for medication refills . Performs ADL's independently . Performs IADL's independently . Calls provider office for new concerns or questions  Initial goal documentation     . "to keep blood sugar good" (pt-stated)       CARE PLAN ENTRY (see longtitudinal plan of care for additional care plan information)  Current Barriers:  [name] Knowledge Deficits related to disease process and Self Health management of DM . Chronic Disease Management support and education needs related to Prediabetes, Essential Hypertension   Nurse Case Manager Clinical Goal(s):  [name] Over the next 90 days, patient will work with CCM RN CM and PCP to address needs related to disease education and support for improved Self Health management of Prediabetes  CCM RN CM Interventions:  05/17/19 call completed with patient  . Evaluation of current treatment plan related to Prediabetes and patient's adherence to plan as established by provider. . Provided education to patient re: current A1c; educated on target ranges and how to maintain and or lower current A1c with medication and  diet adherence, staying active and implementing daily exercise regimen, discussed 150 minutes per week as tolerated . Reviewed medications with patient and discussed indication, frequency and dosage or prescribed Metformin 500 mg XR daily with breakfast . Discussed plans with patient for ongoing care management follow up and provided patient with direct contact information for care management team . Provided patient with printed educational materials related to Diabetes Management with Meal Planning; Carb Choices; signs/symptoms of Hypoglycemia; Life's Simple 7  Patient Self Care Activities:  . Self administers medications as prescribed . Attends all scheduled provider appointments . Calls pharmacy  for medication refills . Performs ADL's independently . Performs IADL's independently . Calls provider office for new concerns or questions  Initial goal documentation     . "to keep my BP under good control" (pt-stated)       CARE PLAN ENTRY (see longtitudinal plan of care for additional care plan information)  Current Barriers:  [name] Knowledge Deficits related to disease process and Self Health management of HTN . Chronic Disease Management support and education needs related to Essential Hypertension and Prediabetes  Nurse Case Manager Clinical Goal(s):  [name] Over the next 90 days, patient will work with CCM RN CM and PCP to address needs related to disease education and support to improve Self Health management of HTN  CCM RN CM Interventions:  05/17/19 call completed with patient  . Evaluation of current treatment plan related to Essential HTN and patient's adherence to plan as established by provider. . Provided education to patient re: target BP <130/80; basic disease education with rationale on importance of controlling BP to help reduce associated complications . Reviewed medications with patient and discussed importance of medication adherence; reviewed medication indication, dosage and frequency of prescribed medications . Discussed plans with patient for ongoing care management follow up and provided patient with direct contact information for care management team . Provided patient with printed educational materials related to What is High Blood Pressure?; African Americans and High Blood Pressure; Why Should I Restrict Sodium; High Blood Pressure and Stroke  Patient Self Care Activities:  . Self administers medications as prescribed . Attends all scheduled provider appointments . Calls pharmacy for medication refills . Performs ADL's independently . Performs IADL's independently . Calls provider office for new concerns or questions  Initial goal documentation          Plan:   Telephone follow up appointment with care management team member scheduled for: 06/14/19  Barb Merino, RN, BSN, CCM Care Management Coordinator [place] [facility]  Direct Phone: [phone]

## 2019-05-21 ENCOUNTER — Telehealth: Payer: Self-pay | Admitting: *Deleted

## 2019-05-21 NOTE — Telephone Encounter (Signed)
Called and spoke with the patient's son (Darrick) and Darrick stated that his dad has not had any falls and was in the drug store as we speak getting a covid shot and I stated to call the [place] office if any concerns or questions at [phone]. Lattie Haw

## 2019-05-21 NOTE — Telephone Encounter (Signed)
-----   Message from Trula Slade, DPM sent at 05/15/2019  6:10 PM EDT ----- I started him on gabapentin. Can you call to see if he is having any side affects of the medication such as making him lethargic/falls or anything else? Thanks.

## 2019-05-28 ENCOUNTER — Telehealth: Payer: Self-pay

## 2019-05-28 ENCOUNTER — Other Ambulatory Visit: Payer: Self-pay

## 2019-05-28 ENCOUNTER — Ambulatory Visit (INDEPENDENT_AMBULATORY_CARE_PROVIDER_SITE_OTHER): Payer: Medicare HMO

## 2019-05-28 DIAGNOSIS — I10 Essential (primary) hypertension: Secondary | ICD-10-CM

## 2019-05-28 DIAGNOSIS — R7303 Prediabetes: Secondary | ICD-10-CM

## 2019-05-29 NOTE — Patient Instructions (Signed)
Visit Information  Goals Addressed      Patient Stated   . "I think I have Gout in my feet" (pt-stated)       CARE PLAN ENTRY (see longtitudinal plan of care for additional care plan information)  Current Barriers:  [name] Knowledge Deficits related to disease process and Self Health management of Gout . Chronic Disease Management support and education needs related to Essential HTN, Prediabetes  Nurse Case Manager Clinical Goal(s):  [name] Over the next 30 days, patient will work with PCP provider to address needs related to evaluate and treat symptoms suggestive of Gout disease   CCM RN CM Interventions:  05/28/19 call completed with patient . Inbound call received from patient stating he feels that he has Gout in his feet; Determined he has not been diagnosed with Gout in the past but attempted a home remedy used for Gout and was given temporary relief . Evaluation of current treatment plan related to Gout and patient's adherence to plan as established by provider . Determined Mr. Kniss would like his PCP provider to prescribe him a medication for this condition . Determined his next f/u visit with [name], FNP is scheduled in June . Sent in basket message to PCP provider [name], FNP to report patient's symptoms and request . Noted PCP provider outreached to patient and he will come into the office on 06/04/19 for further evaluation of this condition  . Discussed plans with patient for ongoing care management follow up and provided patient with direct contact information for care management team  Patient Self Care Activities:  . Self administers medications as prescribed . Attends all scheduled provider appointments . Calls pharmacy for medication refills . Performs ADL's independently . Performs IADL's independently . Calls provider office for new concerns or questions  Initial goal documentation     . "I would like for my legs to feel better" (pt-stated)       [place] (see longtitudinal plan of care for additional care plan information)  Current Barriers:  [name] Knowledge Deficits related to evaluation and treatment for bilateral leg cramps/pain related to Neuropathy . Chronic Disease Management support and education needs related to Essential HTN, Prediabetes  Nurse Case Manager Clinical Goal(s):  [name] Over the next 90 days, patient will work with CCM RN CM and PCP to address needs related to disease education and support to improve Self Health management of Neuropathy  CCM RN CM Interventions:  05/17/19 call completed with patient  . Evaluation of current treatment plan related to Neuropathy and patient's adherence to plan as established by provider. . Provided education to patient re: basic disease process and treatment for Neuropathy, discussed what to monitor for and when to call the doctor if needed . Reviewed medications with patient and discussed patient is waiting to pick up Rx for Gabapentin 100 mg at bedtime; reviewed and discussed dosage, frequency and indication, educated on potential SE to monitor for and report if bothersome . Discussed plans with patient for ongoing care management follow up and provided patient with direct contact information for care management team  Patient Self Care Activities:  . Patient verbalizes understanding of plan to start Gabapentin 100 mg at bedtime for Neuropathy pain as soon as ready at pharmacy . Self administers medications as prescribed . Attends all scheduled provider appointments . Calls pharmacy for medication refills . Performs ADL's independently . Performs IADL's independently . Calls provider office for new concerns or questions  Initial goal  documentation     . "I would like to get help signing up for the COVID 19 Vaccine" (pt-stated)       CARE PLAN ENTRY (see longtitudinal plan of care for additional care plan information)  Current Barriers:  [name] Knowledge Deficits related to how and where  to receive COVID 19 vaccine . Chronic Disease Management support and education needs related to Essential HTN, Prediabetes  Nurse Case Manager Clinical Goal(s):  [name] Over the next 30 days, patient will verbalize understanding of plan for when and where to receive his COVID 19 vaccines  ICCM RN CM Interventions:  05/28/19 call completed with patient  . Determined Mr. Yearby received his 1st COVID vaccine on 05/21/19 and will receive his 2nd COVID vaccine on 06/18/19 . Determined he experienced no SE related to the vaccine and is appreciative of the help to get his vaccines scheduled . Discussed plans with patient for ongoing care management follow up and provided patient with direct contact information for care management team  Patient Self Care Activities:  . Patient verbalizes understanding of plan to receive his 1st and 2nd COVID 19 vaccines, pt aware of time,date and location  . Self administers medications as prescribed . Attends all scheduled provider appointments . Calls pharmacy for medication refills . Performs ADL's independently . Performs IADL's independently . Calls provider office for new concerns or questions  Please see past updates related to this goal by clicking on the "Past Updates" button in the selected goal        Patient verbalizes understanding of instructions provided today.   Telephone follow up appointment with care management team member scheduled for: 06/14/19  Barb Merino, RN, BSN, CCM Care Management Coordinator [place] [facility]  Direct Phone: [phone]

## 2019-05-29 NOTE — Chronic Care Management (AMB) (Signed)
Chronic Care Management   Follow Up Note   05/28/2019 Name: Joseph Olson MRN: [id number] DOB: 10/11/1927  Referred by: [name], FNP Reason for referral : Chronic Care Management (FU Inbound Call from patient )   Joseph Olson is a 85 y.o. year old male who is a primary care patient of [name], [place]. The CCM team was consulted for assistance with chronic disease management and care coordination needs.    Review of patient status, including review of consultants reports, relevant laboratory and other test results, and collaboration with appropriate care team members and the patient's provider was performed as part of comprehensive patient evaluation and provision of chronic care management services.    SDOH (Social Determinants of Health) assessments performed: No See Care Plan activities for detailed interventions related to [place])   Inbound call received from patient stating he feels he has Gout in his feet and would like to have these symptoms further evaluated and treated.    Outpatient Encounter Medications as of 05/28/2019  Medication Sig  . atorvastatin (LIPITOR) 10 MG tablet Take 1 tablet (10 mg total) by mouth daily at 6 PM.  . diclofenac Sodium (VOLTAREN) 1 % GEL APPLY 2 GRAMS TOPICALLY 4 TIMES DAILY.  [name] gabapentin (NEURONTIN) 100 MG capsule Take 1 capsule (100 mg total) by mouth at bedtime.  . hydrochlorothiazide (HYDRODIURIL) 12.5 MG tablet Take 1 tablet (12.5 mg total) by mouth daily.  . metFORMIN (GLUCOPHAGE-XR) 500 MG 24 hr tablet Take 1 tablet (500 mg total) by mouth daily.  . Omega-3 Fatty Acids (FISH OIL PO) Take 1 tablet by mouth daily.  . varenicline (CHANTIX STARTING MONTH PAK) 0.5 MG X 11 & 1 MG X 42 tablet Take one 0.5 mg tablet by mouth once daily for 3 days, then increase to one 0.5 mg tablet twice daily for 4 days, then increase to one 1 mg tablet twice daily.   No facility-administered encounter medications on file as of 05/28/2019.     Objective:  Lab  Results  Component Value Date   HGBA1C 5.8 (H) 04/22/2019   HGBA1C 6.1 (H) 10/11/2018   HGBA1C 5.8 (H) 04/30/2018   Lab Results  Component Value Date   MICROALBUR 10 10/18/2018   LDLCALC 63 04/22/2019   CREATININE 0.95 04/22/2019   BP Readings from Last 3 Encounters:  04/22/19 124/70  01/22/19 132/70  10/18/18 136/72    Goals Addressed      Patient Stated   . "I think I have Gout in my feet" (pt-stated)       CARE PLAN ENTRY (see longtitudinal plan of care for additional care plan information)  Current Barriers:  [name] Knowledge Deficits related to disease process and Self Health management of Gout . Chronic Disease Management support and education needs related to Essential HTN, Prediabetes  Nurse Case Manager Clinical Goal(s):  [name] Over the next 30 days, patient will work with PCP provider to address needs related to evaluate and treat symptoms suggestive of Gout disease   CCM RN CM Interventions:  05/28/19 call completed with patient . Inbound call received from patient stating he feels that he has Gout in his feet; Determined he has not been diagnosed with Gout in the past but attempted a home remedy used for Gout and was given temporary relief . Evaluation of current treatment plan related to Gout and patient's adherence to plan as established by provider . Determined Joseph Olson would like his PCP provider to prescribe him a medication for this condition .  Determined his next f/u visit with [name], FNP is scheduled in June . Sent in basket message to PCP provider [name], FNP to report patient's symptoms and request . Noted PCP provider outreached to patient and he will come into the office on 06/04/19 for further evaluation of this condition  . Discussed plans with patient for ongoing care management follow up and provided patient with direct contact information for care management team  Patient Self Care Activities:  . Self administers medications as  prescribed . Attends all scheduled provider appointments . Calls pharmacy for medication refills . Performs ADL's independently . Performs IADL's independently . Calls provider office for new concerns or questions  Initial goal documentation     . "I would like for my legs to feel better" (pt-stated)       [place] (see longtitudinal plan of care for additional care plan information)  Current Barriers:  [name] Knowledge Deficits related to evaluation and treatment for bilateral leg cramps/pain related to Neuropathy . Chronic Disease Management support and education needs related to Essential HTN, Prediabetes  Nurse Case Manager Clinical Goal(s):  [name] Over the next 90 days, patient will work with CCM RN CM and PCP to address needs related to disease education and support to improve Self Health management of Neuropathy  CCM RN CM Interventions:  05/17/19 call completed with patient  . Evaluation of current treatment plan related to Neuropathy and patient's adherence to plan as established by provider. . Provided education to patient re: basic disease process and treatment for Neuropathy, discussed what to monitor for and when to call the doctor if needed . Reviewed medications with patient and discussed patient is waiting to pick up Rx for Gabapentin 100 mg at bedtime; reviewed and discussed dosage, frequency and indication, educated on potential SE to monitor for and report if bothersome . Discussed plans with patient for ongoing care management follow up and provided patient with direct contact information for care management team  Patient Self Care Activities:  . Patient verbalizes understanding of plan to start Gabapentin 100 mg at bedtime for Neuropathy pain as soon as ready at pharmacy . Self administers medications as prescribed . Attends all scheduled provider appointments . Calls pharmacy for medication refills . Performs ADL's independently . Performs IADL's  independently . Calls provider office for new concerns or questions  Initial goal documentation     . "I would like to get help signing up for the COVID 19 Vaccine" (pt-stated)       CARE PLAN ENTRY (see longtitudinal plan of care for additional care plan information)  Current Barriers:  [name] Knowledge Deficits related to how and where to receive COVID 19 vaccine . Chronic Disease Management support and education needs related to Essential HTN, Prediabetes  Nurse Case Manager Clinical Goal(s):  [name] Over the next 30 days, patient will verbalize understanding of plan for when and where to receive his COVID 19 vaccines  ICCM RN CM Interventions:  05/28/19 call completed with patient  . Determined Joseph Olson received his 1st COVID vaccine on 05/21/19 and will receive his 2nd COVID vaccine on 06/18/19 . Determined he experienced no SE related to the vaccine and is appreciative of the help to get his vaccines scheduled . Discussed plans with patient for ongoing care management follow up and provided patient with direct contact information for care management team  Patient Self Care Activities:  . Patient verbalizes understanding of plan to receive his 1st and 2nd COVID  19 vaccines, pt aware of time,date and location  . Self administers medications as prescribed . Attends all scheduled provider appointments . Calls pharmacy for medication refills . Performs ADL's independently . Performs IADL's independently . Calls provider office for new concerns or questions  Please see past updates related to this goal by clicking on the "Past Updates" button in the selected goal         Plan:   Telephone follow up appointment with care management team member scheduled for: 06/14/19  Barb Merino, RN, BSN, CCM Care Management Coordinator [place] [facility]  Direct Phone: [phone]

## 2019-06-04 ENCOUNTER — Encounter: Payer: Self-pay | Admitting: Nurse Practitioner

## 2019-06-04 ENCOUNTER — Other Ambulatory Visit: Payer: Self-pay

## 2019-06-04 ENCOUNTER — Ambulatory Visit (INDEPENDENT_AMBULATORY_CARE_PROVIDER_SITE_OTHER): Payer: Medicare HMO | Admitting: Nurse Practitioner

## 2019-06-04 VITALS — BP 138/72 | HR 71 | Temp 97.4°F | Ht 67.8 in | Wt 171.6 lb

## 2019-06-04 DIAGNOSIS — R252 Cramp and spasm: Secondary | ICD-10-CM | POA: Diagnosis not present

## 2019-06-04 DIAGNOSIS — Z720 Tobacco use: Secondary | ICD-10-CM

## 2019-06-04 MED ORDER — MAGNESIUM 200 MG PO TABS: 1.0000 | ORAL_TABLET | Freq: Every day | ORAL | 3 refills | Status: DC

## 2019-06-04 MED ORDER — VARENICLINE TARTRATE 1 MG PO TABS: 1.0000 mg | ORAL_TABLET | Freq: Two times a day (BID) | ORAL | 2 refills | Status: DC

## 2019-06-04 NOTE — Progress Notes (Signed)
Subjective:     Patient ID: Joseph Olson , male    DOB: 09/18/1927 , 86 y.o.   MRN: [id number]   Chief Complaint  Patient presents with  . Hypertension    HPI  He is here today to follow up on his smoking cessation. He reports he is doing well with the chantix and he has cut back his smoking to 1/2 pack per day was 1 Pack per day.  He is having tightness to his lower extremities intermittently.  He has not been taking the gabapentin.       Past Medical History:  Diagnosis Date  . Diabetes mellitus without complication ([place])      No family history on file.   Current Outpatient Medications:  .  atorvastatin (LIPITOR) 10 MG tablet, Take 1 tablet (10 mg total) by mouth daily at 6 PM., Disp: 90 tablet, Rfl: 1 .  diclofenac Sodium (VOLTAREN) 1 % GEL, APPLY 2 GRAMS TOPICALLY 4 TIMES DAILY., Disp: 600 g, Rfl: 1 .  hydrochlorothiazide (HYDRODIURIL) 12.5 MG tablet, Take 1 tablet (12.5 mg total) by mouth daily., Disp: 90 tablet, Rfl: 1 .  metFORMIN (GLUCOPHAGE-XR) 500 MG 24 hr tablet, Take 1 tablet (500 mg total) by mouth daily., Disp: 90 tablet, Rfl: 1 .  Omega-3 Fatty Acids (FISH OIL PO), Take 1 tablet by mouth daily., Disp: , Rfl:  .  varenicline (CHANTIX STARTING MONTH PAK) 0.5 MG X 11 & 1 MG X 42 tablet, Take one 0.5 mg tablet by mouth once daily for 3 days, then increase to one 0.5 mg tablet twice daily for 4 days, then increase to one 1 mg tablet twice daily., Disp: 53 tablet, Rfl: 0 .  gabapentin (NEURONTIN) 100 MG capsule, Take 1 capsule (100 mg total) by mouth at bedtime. (Patient not taking: Reported on 06/04/2019), Disp: 90 capsule, Rfl: 0   No Known Allergies   Review of Systems  Constitutional: Negative.   Respiratory: Negative.  Negative for cough and wheezing.   Cardiovascular: Negative.  Negative for leg swelling.  Genitourinary: Negative.   Musculoskeletal: Negative.        Bilateral lower extremities are tension  Neurological: Negative for dizziness.   Psychiatric/Behavioral: Negative.      Today's Vitals   06/04/19 0854  BP: 138/72  Pulse: 71  Temp: (!) 97.4 F (36.3 C)  TempSrc: Oral  Weight: 171 lb 9.6 oz (77.8 kg)  Height: 5' 7.8" (1.722 m)  PainSc: 0-No pain   Body mass index is 26.25 kg/m.   Objective:  Physical Exam Vitals reviewed.  Constitutional:      General: He is not in acute distress.    Appearance: Normal appearance.  Cardiovascular:     Rate and Rhythm: Normal rate and regular rhythm.     Pulses: Normal pulses.     Heart sounds: Normal heart sounds. No murmur.  Pulmonary:     Effort: Pulmonary effort is normal. No respiratory distress.     Breath sounds: Normal breath sounds. No wheezing.  Musculoskeletal:        General: No swelling, tenderness or deformity. Normal range of motion.     Right lower leg: No edema.     Left lower leg: No edema.  Skin:    General: Skin is warm and dry.     Comments: Toenails on bilateral feet are thickened and long  Also has shiny skin to lower extremities with no hair present  Neurological:     General: No focal deficit present.  Mental Status: He is alert and oriented to person, place, and time.     Cranial Nerves: No cranial nerve deficit.  Psychiatric:        Mood and Affect: Mood normal.        Behavior: Behavior normal.        Thought Content: Thought content normal.        Judgment: Judgment normal.         Assessment And Plan:      1. Muscle cramps  He describes a muscle tightness to his lower legs  He is to take magnesium daily with evening meal and increase his water intake. - Magnesium 200 MG TABS; Take 1 tablet (200 mg total) by mouth daily. Take daily with your evening meal  Dispense: 30 tablet; Refill: 3  2. Tobacco abuse  He is down to 1/2 PPD from 1 PPD will continue chantix, tolerating well. - varenicline (CHANTIX CONTINUING MONTH PAK) 1 MG tablet; Take 1 tablet (1 mg total) by mouth 2 (two) times daily.  Dispense: 60 tablet;  Refill: [place], FNP

## 2019-06-11 ENCOUNTER — Ambulatory Visit: Payer: Self-pay

## 2019-06-11 ENCOUNTER — Telehealth: Payer: Self-pay

## 2019-06-11 ENCOUNTER — Other Ambulatory Visit: Payer: Self-pay

## 2019-06-11 DIAGNOSIS — I10 Essential (primary) hypertension: Secondary | ICD-10-CM

## 2019-06-11 DIAGNOSIS — R7303 Prediabetes: Secondary | ICD-10-CM

## 2019-06-12 NOTE — Patient Instructions (Signed)
Visit Information  Goals Addressed      Patient Stated   . "I would like to get help signing up for the COVID 19 Vaccine" (pt-stated)       CARE PLAN ENTRY (see longtitudinal plan of care for additional care plan information)  Current Barriers:  [name] Knowledge Deficits related to how and where to receive COVID 19 vaccine . Chronic Disease Management support and education needs related to Essential HTN, Prediabetes  Nurse Case Manager Clinical Goal(s):  [name] Over the next 30 days, patient will verbalize understanding of plan for when and where to receive his COVID 19 vaccines  CCM RN CM Interventions:  06/11/19 Inbound call completed with patient  . Determined Mr. Silbaugh received his 1st COVID vaccine on 05/21/19 and is calling today to confirm his 2nd COVID vaccine date . Confirmed with Mr. Griggs his 2nd scheduled COVID vaccine is scheduled for 06/18/19 at 9:15 AM at the [facility] on [address], [place] . Confirmed Mr. Waxman's godson will drive him to the appointment as scheduled . Determined he experienced no SE related to the vaccine and is appreciative of the help to get his vaccines scheduled . Discussed plans with patient for ongoing care management follow up and provided patient with direct contact information for care management team  Patient Self Care Activities:  . Patient verbalizes understanding of plan to receive his 1st and 2nd COVID 19 vaccines, pt aware of time,date and location  . Self administers medications as prescribed . Attends all scheduled provider appointments . Calls pharmacy for medication refills . Performs ADL's independently . Performs IADL's independently . Calls provider office for new concerns or questions  Please see past updates related to this goal by clicking on the "Past Updates" button in the selected goal        Patient verbalizes understanding of instructions provided today.   Telephone follow up appointment with care management  team member scheduled for: 07/10/19  Barb Merino, RN, BSN, CCM Care Management Coordinator [place] [facility]  Direct Phone: [phone]

## 2019-06-12 NOTE — Chronic Care Management (AMB) (Addendum)
Chronic Care Management   Follow Up Note   06/11/2019 Name: Joseph Olson MRN: [id number] DOB: Dec 13, 1927  Referred by: [name], FNP Reason for referral : Chronic Care Management (FU RN CM Inbound call from patient )   Joseph Olson is a 86 y.o. year old male who is a primary care patient of [name], [place]. The CCM team was consulted for assistance with chronic disease management and care coordination needs.    Review of patient status, including review of consultants reports, relevant laboratory and other test results, and collaboration with appropriate care team members and the patient's provider was performed as part of comprehensive patient evaluation and provision of chronic care management services.    SDOH (Social Determinants of Health) assessments performed: No See Care Plan activities for detailed interventions related to [place])   Inbound call received from patient to confirm his 2nd COVID vaccine date and time.     Outpatient Encounter Medications as of 06/11/2019  Medication Sig  . atorvastatin (LIPITOR) 10 MG tablet Take 1 tablet (10 mg total) by mouth daily at 6 PM.  . diclofenac Sodium (VOLTAREN) 1 % GEL APPLY 2 GRAMS TOPICALLY 4 TIMES DAILY.  [name] gabapentin (NEURONTIN) 100 MG capsule Take 1 capsule (100 mg total) by mouth at bedtime. (Patient not taking: Reported on 06/04/2019)  . hydrochlorothiazide (HYDRODIURIL) 12.5 MG tablet Take 1 tablet (12.5 mg total) by mouth daily.  . Magnesium 200 MG TABS Take 1 tablet (200 mg total) by mouth daily. Take daily with your evening meal  . metFORMIN (GLUCOPHAGE-XR) 500 MG 24 hr tablet Take 1 tablet (500 mg total) by mouth daily.  . Omega-3 Fatty Acids (FISH OIL PO) Take 1 tablet by mouth daily.  . varenicline (CHANTIX CONTINUING MONTH PAK) 1 MG tablet Take 1 tablet (1 mg total) by mouth 2 (two) times daily.   No facility-administered encounter medications on file as of 06/11/2019.     Objective: Lab Results  Component  Value Date   HGBA1C 5.8 (H) 04/22/2019   HGBA1C 6.1 (H) 10/11/2018   HGBA1C 5.8 (H) 04/30/2018   Lab Results  Component Value Date   MICROALBUR 10 10/18/2018   LDLCALC 63 04/22/2019   CREATININE 0.95 04/22/2019   BP Readings from Last 3 Encounters:  06/04/19 138/72  04/22/19 124/70  01/22/19 132/70     Goals Addressed      Patient Stated   . "I would like to get help signing up for the COVID 19 Vaccine" (pt-stated)       CARE PLAN ENTRY (see longtitudinal plan of care for additional care plan information)  Current Barriers:  [name] Knowledge Deficits related to how and where to receive COVID 19 vaccine . Chronic Disease Management support and education needs related to Essential HTN, Prediabetes  Nurse Case Manager Clinical Goal(s):  [name] Over the next 30 days, patient will verbalize understanding of plan for when and where to receive his COVID 19 vaccines  CCM RN CM Interventions:  06/11/19 Inbound call completed with patient  . Determined Mr. Markell received his 1st COVID vaccine on 05/21/19 and is calling today to confirm his 2nd COVID vaccine date . Confirmed with Mr. Fay his 2nd scheduled COVID vaccine is scheduled for 06/18/19 at 9:15 AM at the [facility] on [address], [place] . Confirmed Mr. Zarzycki's godson will drive him to the appointment as scheduled . Determined he experienced no SE related to the vaccine and is appreciative of the help to get his vaccines  scheduled . Discussed plans with patient for ongoing care management follow up and provided patient with direct contact information for care management team  Patient Self Care Activities:  . Patient verbalizes understanding of plan to receive his 1st and 2nd COVID 19 vaccines, pt aware of time,date and location  . Self administers medications as prescribed . Attends all scheduled provider appointments . Calls pharmacy for medication refills . Performs ADL's independently . Performs IADL's  independently . Calls provider office for new concerns or questions  Please see past updates related to this goal by clicking on the "Past Updates" button in the selected goal         Plan:   Telephone follow up appointment with care management team member scheduled for: 07/04/19  Barb Merino, RN, BSN, CCM Care Management Coordinator [place] [facility]  Direct Phone: [phone]

## 2019-06-14 ENCOUNTER — Telehealth: Payer: Self-pay

## 2019-06-18 ENCOUNTER — Telehealth: Payer: Self-pay

## 2019-06-18 ENCOUNTER — Other Ambulatory Visit: Payer: Self-pay

## 2019-06-18 ENCOUNTER — Ambulatory Visit: Payer: Self-pay

## 2019-06-18 DIAGNOSIS — R7303 Prediabetes: Secondary | ICD-10-CM

## 2019-06-18 DIAGNOSIS — I10 Essential (primary) hypertension: Secondary | ICD-10-CM

## 2019-06-18 DIAGNOSIS — R252 Cramp and spasm: Secondary | ICD-10-CM

## 2019-06-19 NOTE — Chronic Care Management (AMB) (Signed)
Chronic Care Management   Follow Up Note   06/18/2019 Name: Joseph Olson MRN: [id number] DOB: 11-04-1927  Referred by: [name], FNP Reason for referral : Chronic Care Management (Inbound call from patient )   Joseph Olson is a 86 y.o. year old male who is a primary care patient of [name], [place]. The CCM team was consulted for assistance with chronic disease management and care coordination needs.    Review of patient status, including review of consultants reports, relevant laboratory and other test results, and collaboration with appropriate care team members and the patient's provider was performed as part of comprehensive patient evaluation and provision of chronic care management services.    SDOH (Social Determinants of Health) assessments performed: Yes - no acute challenges identified at this time  See Care Plan activities for detailed interventions related to [place])   Inbound call received from patient regarding his COVID vaccine and newly prescribed medication for leg/foot cramps.     Outpatient Encounter Medications as of 06/18/2019  Medication Sig  . atorvastatin (LIPITOR) 10 MG tablet Take 1 tablet (10 mg total) by mouth daily at 6 PM.  . diclofenac Sodium (VOLTAREN) 1 % GEL APPLY 2 GRAMS TOPICALLY 4 TIMES DAILY.  [name] gabapentin (NEURONTIN) 100 MG capsule Take 1 capsule (100 mg total) by mouth at bedtime. (Patient not taking: Reported on 06/04/2019)  . hydrochlorothiazide (HYDRODIURIL) 12.5 MG tablet Take 1 tablet (12.5 mg total) by mouth daily.  . Magnesium 200 MG TABS Take 1 tablet (200 mg total) by mouth daily. Take daily with your evening meal  . metFORMIN (GLUCOPHAGE-XR) 500 MG 24 hr tablet Take 1 tablet (500 mg total) by mouth daily.  . Omega-3 Fatty Acids (FISH OIL PO) Take 1 tablet by mouth daily.  . varenicline (CHANTIX CONTINUING MONTH PAK) 1 MG tablet Take 1 tablet (1 mg total) by mouth 2 (two) times daily.   No facility-administered encounter medications  on file as of 06/18/2019.     Objective:  Lab Results  Component Value Date   HGBA1C 5.8 (H) 04/22/2019   HGBA1C 6.1 (H) 10/11/2018   HGBA1C 5.8 (H) 04/30/2018   Lab Results  Component Value Date   MICROALBUR 10 10/18/2018   LDLCALC 63 04/22/2019   CREATININE 0.95 04/22/2019   BP Readings from Last 3 Encounters:  06/04/19 138/72  04/22/19 124/70  01/22/19 132/70    Goals Addressed      Patient Stated   . COMPLETED: "I think I have Gout in my feet" (pt-stated)       CARE PLAN ENTRY (see longtitudinal plan of care for additional care plan information)  Current Barriers:  [name] Knowledge Deficits related to disease process and Self Health management of Gout . Chronic Disease Management support and education needs related to Essential HTN, Prediabetes  Nurse Case Manager Clinical Goal(s):  [name] Over the next 30 days, patient will work with PCP provider to address needs related to evaluate and treat symptoms suggestive of Gout disease   CCM RN CM Interventions:  05/28/19 call completed with patient . Inbound call received from patient  . Determined patient f/u with PCP provider [name], FNP on 06/05/19 for further evaluation of persistent leg/foot cramping . Determined patient did not receive Gout diagnosis, no labs were recommended at this time   Determined and reviewed with patient, PCP recommended him to take magnesium daily with evening meal and increase his water intake; . - Magnesium 200 MG TABS; Take 1 tablet (200 mg total)  by mouth daily. Take daily with your evening meal  Dispense: 30 tablet; Refill: 3 . Discussed patient has not received this medication due to it was sent to his [facility] mail order pharmacy . Instructed patient he can purchase this medication OTC if he prefers, in order to get started on the medication  . Discussed patient plans to have his stepson drive him to pharmacy, he will purchase this medication in small supply from [facility] until his mail order  arrives   Patient Self Care Activities:  . Self administers medications as prescribed . Attends all scheduled provider appointments . Calls pharmacy for medication refills . Performs ADL's independently . Performs IADL's independently . Calls provider office for new concerns or questions  Please see past updates related to this goal by clicking on the "Past Updates" button in the selected goal      . "I would like for my legs to feel better" (pt-stated)       [place] (see longtitudinal plan of care for additional care plan information)  Current Barriers:  [name] Knowledge Deficits related to evaluation and treatment for bilateral leg cramps/pain related to Neuropathy . Chronic Disease Management support and education needs related to Essential HTN, Prediabetes  Nurse Case Manager Clinical Goal(s):  [name] Over the next 90 days, patient will work with CCM RN CM and PCP to address needs related to disease education and support to improve Self Health management of Neuropathy  CCM RN CM Interventions:  06/18/19 call completed with patient  . Inbound call from patient  . Evaluation of current treatment plan related to Neuropathy and patient's adherence to plan as established by provider. . Determined patient f/u with PCP provider [name], FNP on 06/05/19 for further evaluation of persistent leg/foot cramping  Determined and reviewed with patient, PCP recommended him to take magnesium daily with evening meal and increase his water intake; . - Magnesium 200 MG TABS; Take 1 tablet (200 mg total) by mouth daily. Take daily with your evening meal  Dispense: 30 tablet; Refill: 3 . Discussed patient has not received this medication due to it was sent to his [facility] mail order pharmacy . Instructed patient he can purchase this medication OTC if he prefers, in order to get started on the medication  . Discussed patient plans to have his stepson drive him to pharmacy, he will purchase this  medication in small supply from [facility] until his mail order arrives  . Discussed plans with patient for ongoing care management follow up and provided patient with direct contact information for care management team  Patient Self Care Activities:  . Patient verbalizes understanding of plan to start Gabapentin 100 mg at bedtime for Neuropathy pain as soon as ready at pharmacy . Self administers medications as prescribed . Attends all scheduled provider appointments . Calls pharmacy for medication refills . Performs ADL's independently . Performs IADL's independently . Calls provider office for new concerns or questions  Please see past updates related to this goal by clicking on the "Past Updates" button in the selected goal      . COMPLETED: "I would like to get help signing up for the COVID 19 Vaccine" (pt-stated)       CARE PLAN ENTRY (see longtitudinal plan of care for additional care plan information)  Current Barriers:  [name] Knowledge Deficits related to how and where to receive COVID 19 vaccine . Chronic Disease Management support and education needs related to Essential HTN, Prediabetes  Nurse Case  Manager Clinical Goal(s):  [name] Over the next 30 days, patient will verbalize understanding of plan for when and where to receive his COVID 19 vaccines  CCM RN CM Interventions:  06/18/19 Inbound call completed with patient  . Inbound call from patient to advise he received his second COVID vaccine today  . Reiterated importance to use ongoing precautions with mask wearing, stay 6 ft away from people and use good handwashing  . Discussed plans with patient for ongoing care management follow up and provided patient with direct contact information for care management team  Patient Self Care Activities:  . Patient verbalizes understanding of plan to receive his 1st and 2nd COVID 19 vaccines, pt aware of time,date and location  . Self administers medications as prescribed . Attends all  scheduled provider appointments . Calls pharmacy for medication refills . Performs ADL's independently . Performs IADL's independently . Calls provider office for new concerns or questions  Please see past updates related to this goal by clicking on the "Past Updates" button in the selected goal         Plan:   Telephone follow up appointment with care management team member scheduled for: 07/04/19  Barb Merino, RN, BSN, CCM Care Management Coordinator [place] [facility]  Direct Phone: [phone]

## 2019-06-19 NOTE — Patient Instructions (Signed)
Visit Information  Goals Addressed      Patient Stated   . COMPLETED: "I think I have Gout in my feet" (pt-stated)       CARE PLAN ENTRY (see longtitudinal plan of care for additional care plan information)  Current Barriers:  [name] Knowledge Deficits related to disease process and Self Health management of Gout . Chronic Disease Management support and education needs related to Essential HTN, Prediabetes  Nurse Case Manager Clinical Goal(s):  [name] Over the next 30 days, patient will work with PCP provider to address needs related to evaluate and treat symptoms suggestive of Gout disease   CCM RN CM Interventions:  05/28/19 call completed with patient . Inbound call received from patient  . Determined patient f/u with PCP provider [name], FNP on 06/05/19 for further evaluation of persistent leg/foot cramping . Determined patient did not receive Gout diagnosis, no labs were recommended at this time   Determined and reviewed with patient, PCP recommended him to take magnesium daily with evening meal and increase his water intake; . - Magnesium 200 MG TABS; Take 1 tablet (200 mg total) by mouth daily. Take daily with your evening meal  Dispense: 30 tablet; Refill: 3 . Discussed patient has not received this medication due to it was sent to his [facility] mail order pharmacy . Instructed patient he can purchase this medication OTC if he prefers, in order to get started on the medication  . Discussed patient plans to have his stepson drive him to pharmacy, he will purchase this medication in small supply from [facility] until his mail order arrives   Patient Self Care Activities:  . Self administers medications as prescribed . Attends all scheduled provider appointments . Calls pharmacy for medication refills . Performs ADL's independently . Performs IADL's independently . Calls provider office for new concerns or questions  Please see past updates related to this goal by clicking on the  "Past Updates" button in the selected goal      . "I would like for my legs to feel better" (pt-stated)       [place] (see longtitudinal plan of care for additional care plan information)  Current Barriers:  [name] Knowledge Deficits related to evaluation and treatment for bilateral leg cramps/pain related to Neuropathy . Chronic Disease Management support and education needs related to Essential HTN, Prediabetes  Nurse Case Manager Clinical Goal(s):  [name] Over the next 90 days, patient will work with CCM RN CM and PCP to address needs related to disease education and support to improve Self Health management of Neuropathy  CCM RN CM Interventions:  06/18/19 call completed with patient  . Inbound call from patient  . Evaluation of current treatment plan related to Neuropathy and patient's adherence to plan as established by provider. . Determined patient f/u with PCP provider [name], FNP on 06/05/19 for further evaluation of persistent leg/foot cramping  Determined and reviewed with patient, PCP recommended him to take magnesium daily with evening meal and increase his water intake; . - Magnesium 200 MG TABS; Take 1 tablet (200 mg total) by mouth daily. Take daily with your evening meal  Dispense: 30 tablet; Refill: 3 . Discussed patient has not received this medication due to it was sent to his [facility] mail order pharmacy . Instructed patient he can purchase this medication OTC if he prefers, in order to get started on the medication  . Discussed patient plans to have his stepson drive him to pharmacy, he will  purchase this medication in small supply from [facility] until his mail order arrives  . Discussed plans with patient for ongoing care management follow up and provided patient with direct contact information for care management team  Patient Self Care Activities:  . Patient verbalizes understanding of plan to start Gabapentin 100 mg at bedtime for Neuropathy pain as soon as  ready at pharmacy . Self administers medications as prescribed . Attends all scheduled provider appointments . Calls pharmacy for medication refills . Performs ADL's independently . Performs IADL's independently . Calls provider office for new concerns or questions  Please see past updates related to this goal by clicking on the "Past Updates" button in the selected goal      . COMPLETED: "I would like to get help signing up for the COVID 19 Vaccine" (pt-stated)       CARE PLAN ENTRY (see longtitudinal plan of care for additional care plan information)  Current Barriers:  [name] Knowledge Deficits related to how and where to receive COVID 19 vaccine . Chronic Disease Management support and education needs related to Essential HTN, Prediabetes  Nurse Case Manager Clinical Goal(s):  [name] Over the next 30 days, patient will verbalize understanding of plan for when and where to receive his COVID 19 vaccines  CCM RN CM Interventions:  06/18/19 Inbound call completed with patient  . Inbound call from patient to advise he received his second COVID vaccine today  . Reiterated importance to use ongoing precautions with mask wearing, stay 6 ft away from people and use good handwashing  . Discussed plans with patient for ongoing care management follow up and provided patient with direct contact information for care management team  Patient Self Care Activities:  . Patient verbalizes understanding of plan to receive his 1st and 2nd COVID 19 vaccines, pt aware of time,date and location  . Self administers medications as prescribed . Attends all scheduled provider appointments . Calls pharmacy for medication refills . Performs ADL's independently . Performs IADL's independently . Calls provider office for new concerns or questions  Please see past updates related to this goal by clicking on the "Past Updates" button in the selected goal       Patient verbalizes understanding of instructions  provided today.   Telephone follow up appointment with care management team member scheduled for: 07/04/19  Barb Merino, RN, BSN, CCM Care Management Coordinator [place] [facility]  Direct Phone: [phone]

## 2019-07-04 ENCOUNTER — Telehealth: Payer: Self-pay

## 2019-07-10 ENCOUNTER — Ambulatory Visit: Payer: Self-pay

## 2019-07-10 ENCOUNTER — Other Ambulatory Visit: Payer: Self-pay

## 2019-07-10 ENCOUNTER — Telehealth: Payer: Self-pay

## 2019-07-10 DIAGNOSIS — I10 Essential (primary) hypertension: Secondary | ICD-10-CM

## 2019-07-10 DIAGNOSIS — R7303 Prediabetes: Secondary | ICD-10-CM

## 2019-07-10 NOTE — Chronic Care Management (AMB) (Signed)
  Chronic Care Management   Outreach Note  07/10/2019 Name: Joseph Olson MRN: [id number] DOB: 17-Dec-1927  Referred by: [name], FNP Reason for referral : Chronic Care Management (FU RN CM Call - inbound call from patient )  Voice message received from Mr. Marcinek requesting a return phone call. An unsuccessful telephone outreach was attempted today. The patient was referred to the case management team for assistance with care management and care coordination.   Follow Up Plan: A HIPPA compliant phone message was left for the patient providing contact information and requesting a return call.  Telephone follow up appointment with care management team member scheduled for: 08/01/19  Barb Merino, RN, BSN, CCM Care Management Coordinator [place] [facility]  Direct Phone: [phone] '

## 2019-07-16 ENCOUNTER — Ambulatory Visit: Payer: Medicare HMO | Admitting: Podiatry

## 2019-07-17 ENCOUNTER — Other Ambulatory Visit: Payer: Self-pay | Admitting: Internal Medicine

## 2019-07-17 DIAGNOSIS — R202 Paresthesia of skin: Secondary | ICD-10-CM

## 2019-07-24 ENCOUNTER — Other Ambulatory Visit: Payer: Self-pay

## 2019-07-24 ENCOUNTER — Encounter: Payer: Self-pay | Admitting: Nurse Practitioner

## 2019-07-24 ENCOUNTER — Ambulatory Visit (INDEPENDENT_AMBULATORY_CARE_PROVIDER_SITE_OTHER): Payer: Medicare HMO | Admitting: Nurse Practitioner

## 2019-07-24 VITALS — BP 130/72 | HR 84 | Temp 98.1°F | Ht 67.8 in | Wt 170.6 lb

## 2019-07-24 DIAGNOSIS — Z720 Tobacco use: Secondary | ICD-10-CM

## 2019-07-24 DIAGNOSIS — R202 Paresthesia of skin: Secondary | ICD-10-CM | POA: Diagnosis not present

## 2019-07-24 DIAGNOSIS — R7303 Prediabetes: Secondary | ICD-10-CM

## 2019-07-24 DIAGNOSIS — I10 Essential (primary) hypertension: Secondary | ICD-10-CM | POA: Diagnosis not present

## 2019-07-24 MED ORDER — GABAPENTIN 100 MG PO CAPS: 100.0000 mg | ORAL_CAPSULE | Freq: Every day | ORAL | 1 refills | Status: AC

## 2019-07-24 NOTE — Progress Notes (Signed)
Subjective:     Patient ID: Joseph Olson , male    DOB: [birth year] , 86 y.o.   MRN: [id number]   Chief Complaint  Patient presents with  . Prediabetes  . Hypertension    HPI  He is interested in quitting smoking has tried the nicotine gum but will "spit it out" then smoke.  Continues to have intermittent tingling to his lower extremities  Hypertension This is a chronic problem. The current episode started more than 1 year ago. The problem is unchanged. The problem is controlled. Pertinent negatives include no anxiety, blurred vision, chest pain, headaches or palpitations. There are no associated agents to hypertension. There are no known risk factors for coronary artery disease. Past treatments include diuretics. The current treatment provides no improvement. There is no history of angina. There is no history of chronic renal disease.  Diabetes He presents for his follow-up diabetic visit. Diabetes type: prediabetes. His disease course has been stable. Pertinent negatives for hypoglycemia include no confusion, dizziness or headaches. Pertinent negatives for diabetes include no blurred vision, no chest pain, no polydipsia, no polyphagia and no polyuria. There are no hypoglycemic complications. Symptoms are stable. There are no diabetic complications. Risk factors for coronary artery disease include sedentary lifestyle and male sex.     Past Medical History:  Diagnosis Date  . Diabetes mellitus without complication ([place])      No family history on file.   Current Outpatient Medications:  .  atorvastatin (LIPITOR) 10 MG tablet, Take 1 tablet (10 mg total) by mouth daily at 6 PM., Disp: 90 tablet, Rfl: 1 .  diclofenac Sodium (VOLTAREN) 1 % GEL, APPLY 2 GRAMS TOPICALLY 4 TIMES DAILY., Disp: 700 g, Rfl: 1 .  hydrochlorothiazide (HYDRODIURIL) 12.5 MG tablet, Take 1 tablet (12.5 mg total) by mouth daily., Disp: 90 tablet, Rfl: 1 .  Magnesium 200 MG TABS, Take 1 tablet (200 mg total) by mouth  daily. Take daily with your evening meal, Disp: 30 tablet, Rfl: 3 .  metFORMIN (GLUCOPHAGE-XR) 500 MG 24 hr tablet, Take 1 tablet (500 mg total) by mouth daily., Disp: 90 tablet, Rfl: 1 .  Omega-3 Fatty Acids (FISH OIL PO), Take 1 tablet by mouth daily., Disp: , Rfl:  .  varenicline (CHANTIX CONTINUING MONTH PAK) 1 MG tablet, Take 1 tablet (1 mg total) by mouth 2 (two) times daily., Disp: 60 tablet, Rfl: 2 .  gabapentin (NEURONTIN) 100 MG capsule, Take 1 capsule (100 mg total) by mouth at bedtime. (Patient not taking: Reported on 06/04/2019), Disp: 90 capsule, Rfl: 0   No Known Allergies   Review of Systems  Constitutional: Negative.   Eyes: Negative for blurred vision.  Respiratory: Negative.  Negative for cough.   Cardiovascular: Negative.  Negative for chest pain, palpitations and leg swelling.  Gastrointestinal: Negative.   Endocrine: Negative for polydipsia, polyphagia and polyuria.  Genitourinary: Negative.   Musculoskeletal: Negative.   Skin: Negative.   Neurological: Negative for dizziness and headaches.  Psychiatric/Behavioral: Negative for confusion.     Today's Vitals   07/24/19 0843  BP: 130/72  Pulse: 84  Temp: 98.1 F (36.7 C)  TempSrc: Oral  Weight: 170 lb 9.6 oz (77.4 kg)  Height: 5' 7.8" (1.722 m)  PainSc: 10-Worst pain ever  PainLoc: Leg   Body mass index is 26.09 kg/m.   Objective:  Physical Exam Vitals reviewed.  Constitutional:      General: He is not in acute distress.    Appearance: Normal appearance.  Cardiovascular:  Rate and Rhythm: Normal rate and regular rhythm.     Pulses: Normal pulses.     Heart sounds: Normal heart sounds. No murmur.  Pulmonary:     Effort: Pulmonary effort is normal. No respiratory distress.     Breath sounds: Normal breath sounds. No wheezing.  Musculoskeletal:        General: No swelling, tenderness or deformity. Normal range of motion.     Right lower leg: No edema.     Left lower leg: No edema.  Skin:     General: Skin is warm and dry.     Capillary Refill: Capillary refill takes less than 2 seconds.     Comments:  has shiny skin to lower extremities with no hair present  Neurological:     General: No focal deficit present.     Mental Status: He is alert and oriented to person, place, and time.  Psychiatric:        Mood and Affect: Mood normal.        Behavior: Behavior normal.        Thought Content: Thought content normal.        Judgment: Judgment normal.         Assessment And Plan:     1. Prediabetes  Chronic, stable  No labs this visit  Continue with current medications  Encouraged to limit intake of sugary foods and drinks  2. Essential hypertension B/P is well controlled.  No labs this CMP ordered to check renal function.   3. Tingling of skin  Slightly improved however continues even with the gabapentin  Will order arterial doppler with ABI  Advised to continue with ASA 81 mg daily  - gabapentin (NEURONTIN) 100 MG capsule; Take 1 capsule (100 mg total) by mouth at bedtime.  Dispense: 90 capsule; Refill: 1 - Ambulatory referral to Vascular Surgery  4. Tobacco abuse  Admits to not taking the Chantix daily, advised important to take as directed to help with quitting smoking                   [name], FNP

## 2019-07-26 ENCOUNTER — Other Ambulatory Visit: Payer: Self-pay | Admitting: Nurse Practitioner

## 2019-07-26 DIAGNOSIS — Z720 Tobacco use: Secondary | ICD-10-CM

## 2019-07-27 NOTE — Telephone Encounter (Signed)
Chantix refill

## 2019-08-01 ENCOUNTER — Telehealth: Payer: Self-pay

## 2019-09-06 ENCOUNTER — Telehealth: Payer: Self-pay

## 2019-09-11 ENCOUNTER — Other Ambulatory Visit: Payer: Self-pay

## 2019-09-11 DIAGNOSIS — R202 Paresthesia of skin: Secondary | ICD-10-CM

## 2019-09-11 MED ORDER — DICLOFENAC SODIUM 1 % EX GEL: CUTANEOUS | 1 refills | Status: DC

## 2019-09-27 ENCOUNTER — Other Ambulatory Visit: Payer: Self-pay | Admitting: Nurse Practitioner

## 2019-09-27 DIAGNOSIS — Z720 Tobacco use: Secondary | ICD-10-CM

## 2019-10-03 ENCOUNTER — Other Ambulatory Visit: Payer: Self-pay | Admitting: Nurse Practitioner

## 2019-10-03 DIAGNOSIS — R7303 Prediabetes: Secondary | ICD-10-CM

## 2019-10-22 ENCOUNTER — Telehealth: Payer: Self-pay

## 2019-10-23 ENCOUNTER — Other Ambulatory Visit: Payer: Self-pay

## 2019-10-23 ENCOUNTER — Encounter: Payer: Self-pay | Admitting: Nurse Practitioner

## 2019-10-23 ENCOUNTER — Ambulatory Visit (INDEPENDENT_AMBULATORY_CARE_PROVIDER_SITE_OTHER): Payer: Medicare HMO

## 2019-10-23 ENCOUNTER — Ambulatory Visit (INDEPENDENT_AMBULATORY_CARE_PROVIDER_SITE_OTHER): Payer: Medicare HMO | Admitting: Nurse Practitioner

## 2019-10-23 VITALS — BP 138/66 | HR 102 | Temp 97.4°F | Ht 67.8 in | Wt 159.0 lb

## 2019-10-23 VITALS — Ht 68.0 in | Wt 159.0 lb

## 2019-10-23 DIAGNOSIS — Z23 Encounter for immunization: Secondary | ICD-10-CM

## 2019-10-23 DIAGNOSIS — Z125 Encounter for screening for malignant neoplasm of prostate: Secondary | ICD-10-CM

## 2019-10-23 DIAGNOSIS — R351 Nocturia: Secondary | ICD-10-CM | POA: Diagnosis not present

## 2019-10-23 DIAGNOSIS — R7303 Prediabetes: Secondary | ICD-10-CM

## 2019-10-23 DIAGNOSIS — H6123 Impacted cerumen, bilateral: Secondary | ICD-10-CM | POA: Diagnosis not present

## 2019-10-23 DIAGNOSIS — I10 Essential (primary) hypertension: Secondary | ICD-10-CM | POA: Diagnosis not present

## 2019-10-23 DIAGNOSIS — Z0000 Encounter for general adult medical examination without abnormal findings: Secondary | ICD-10-CM

## 2019-10-23 MED ORDER — PNEUMOCOCCAL 13-VAL CONJ VACC IM SUSP: 0.5000 mL | INTRAMUSCULAR | 0 refills | Status: AC

## 2019-10-23 MED ORDER — TETANUS-DIPHTH-ACELL PERTUSSIS 5-2.5-18.5 LF-MCG/0.5 IM SUSP: 0.5000 mL | Freq: Once | INTRAMUSCULAR | 0 refills | Status: AC

## 2019-10-23 NOTE — Patient Instructions (Signed)
Mr. Joseph Olson , Thank you for taking time to come for your Medicare Wellness Visit. I appreciate your ongoing commitment to your health goals. Please review the following plan we discussed and let me know if I can assist you in the future.   Screening recommendations/referrals: Colonoscopy: not required Recommended yearly ophthalmology/optometry visit for glaucoma screening and checkup Recommended yearly dental visit for hygiene and checkup  Vaccinations: Influenza vaccine: today Pneumococcal vaccine: sent to pharmacy Tdap vaccine: sent to pharmacy Shingles vaccine: discussed   Covid-19:  06/18/2019, 05/21/2019  Advanced directives: Advance directive discussed with you today.    Conditions/risks identified: smoking  Next appointment: 01/28/2020 at 9:45 Follow up in one year for your annual wellness visit.   Preventive Care 7 Years and Older, Male Preventive care refers to lifestyle choices and visits with your health care provider that can promote health and wellness. What does preventive care include?  A yearly physical exam. This is also called an annual well check.  Dental exams once or twice a year.  Routine eye exams. Ask your health care provider how often you should have your eyes checked.  Personal lifestyle choices, including:  Daily care of your teeth and gums.  Regular physical activity.  Eating a healthy diet.  Avoiding tobacco and drug use.  Limiting alcohol use.  Practicing safe sex.  Taking low doses of aspirin every day.  Taking vitamin and mineral supplements as recommended by your health care provider. What happens during an annual well check? The services and screenings done by your health care provider during your annual well check will depend on your age, overall health, lifestyle risk factors, and family history of disease. Counseling  Your health care provider may ask you questions about your:  Alcohol use.  Tobacco use.  Drug  use.  Emotional well-being.  Home and relationship well-being.  Sexual activity.  Eating habits.  History of falls.  Memory and ability to understand (cognition).  Work and work Statistician. Screening  You may have the following tests or measurements:  Height, weight, and BMI.  Blood pressure.  Lipid and cholesterol levels. These may be checked every 5 years, or more frequently if you are over 59 years old.  Skin check.  Lung cancer screening. You may have this screening every year starting at age 36 if you have a 30-pack-year history of smoking and currently smoke or have quit within the past 15 years.  Fecal occult blood test (FOBT) of the stool. You may have this test every year starting at age 34.  Flexible sigmoidoscopy or colonoscopy. You may have a sigmoidoscopy every 5 years or a colonoscopy every 10 years starting at age 60.  Prostate cancer screening. Recommendations will vary depending on your family history and other risks.  Hepatitis C blood test.  Hepatitis B blood test.  Sexually transmitted disease (STD) testing.  Diabetes screening. This is done by checking your blood sugar (glucose) after you have not eaten for a while (fasting). You may have this done every 1-3 years.  Abdominal aortic aneurysm (AAA) screening. You may need this if you are a current or former smoker.  Osteoporosis. You may be screened starting at age 29 if you are at high risk. Talk with your health care provider about your test results, treatment options, and if necessary, the need for more tests. Vaccines  Your health care provider may recommend certain vaccines, such as:  Influenza vaccine. This is recommended every year.  Tetanus, diphtheria, and acellular pertussis (Tdap,  Td) vaccine. You may need a Td booster every 10 years.  Zoster vaccine. You may need this after age 58.  Pneumococcal 13-valent conjugate (PCV13) vaccine. One dose is recommended after age  48.  Pneumococcal polysaccharide (PPSV23) vaccine. One dose is recommended after age 63. Talk to your health care provider about which screenings and vaccines you need and how often you need them. This information is not intended to replace advice given to you by your health care provider. Make sure you discuss any questions you have with your health care provider. Document Released: 03/06/2015 Document Revised: 10/28/2015 Document Reviewed: 12/09/2014 Elsevier Interactive Patient Education  2017 [place] Prevention in the Home Falls can cause injuries. They can happen to people of all ages. There are many things you can do to make your home safe and to help prevent falls. What can I do on the outside of my home?  Regularly fix the edges of walkways and driveways and fix any cracks.  Remove anything that might make you trip as you walk through a door, such as a raised step or threshold.  Trim any bushes or trees on the path to your home.  Use bright outdoor lighting.  Clear any walking paths of anything that might make someone trip, such as rocks or tools.  Regularly check to see if handrails are loose or broken. Make sure that both sides of any steps have handrails.  Any raised decks and porches should have guardrails on the edges.  Have any leaves, snow, or ice cleared regularly.  Use sand or salt on walking paths during winter.  Clean up any spills in your garage right away. This includes oil or grease spills. What can I do in the bathroom?  Use night lights.  Install grab bars by the toilet and in the tub and shower. Do not use towel bars as grab bars.  Use non-skid mats or decals in the tub or shower.  If you need to sit down in the shower, use a plastic, non-slip stool.  Keep the floor dry. Clean up any water that spills on the floor as soon as it happens.  Remove soap buildup in the tub or shower regularly.  Attach bath mats securely with double-sided  non-slip rug tape.  Do not have throw rugs and other things on the floor that can make you trip. What can I do in the bedroom?  Use night lights.  Make sure that you have a light by your bed that is easy to reach.  Do not use any sheets or blankets that are too big for your bed. They should not hang down onto the floor.  Have a firm chair that has side arms. You can use this for support while you get dressed.  Do not have throw rugs and other things on the floor that can make you trip. What can I do in the kitchen?  Clean up any spills right away.  Avoid walking on wet floors.  Keep items that you use a lot in easy-to-reach places.  If you need to reach something above you, use a strong step stool that has a grab bar.  Keep electrical cords out of the way.  Do not use floor polish or wax that makes floors slippery. If you must use wax, use non-skid floor wax.  Do not have throw rugs and other things on the floor that can make you trip. What can I do with my stairs?  Do not  leave any items on the stairs.  Make sure that there are handrails on both sides of the stairs and use them. Fix handrails that are broken or loose. Make sure that handrails are as long as the stairways.  Check any carpeting to make sure that it is firmly attached to the stairs. Fix any carpet that is loose or worn.  Avoid having throw rugs at the top or bottom of the stairs. If you do have throw rugs, attach them to the floor with carpet tape.  Make sure that you have a light switch at the top of the stairs and the bottom of the stairs. If you do not have them, ask someone to add them for you. What else can I do to help prevent falls?  Wear shoes that:  Do not have high heels.  Have rubber bottoms.  Are comfortable and fit you well.  Are closed at the toe. Do not wear sandals.  If you use a stepladder:  Make sure that it is fully opened. Do not climb a closed stepladder.  Make sure that both  sides of the stepladder are locked into place.  Ask someone to hold it for you, if possible.  Clearly mark and make sure that you can see:  Any grab bars or handrails.  First and last steps.  Where the edge of each step is.  Use tools that help you move around (mobility aids) if they are needed. These include:  Canes.  Walkers.  Scooters.  Crutches.  Turn on the lights when you go into a dark area. Replace any light bulbs as soon as they burn out.  Set up your furniture so you have a clear path. Avoid moving your furniture around.  If any of your floors are uneven, fix them.  If there are any pets around you, be aware of where they are.  Review your medicines with your doctor. Some medicines can make you feel dizzy. This can increase your chance of falling. Ask your doctor what other things that you can do to help prevent falls. This information is not intended to replace advice given to you by your health care provider. Make sure you discuss any questions you have with your health care provider. Document Released: 12/04/2008 Document Revised: 07/16/2015 Document Reviewed: 03/14/2014 Elsevier Interactive Patient Education  2017 [employer].

## 2019-10-23 NOTE — Patient Instructions (Signed)

## 2019-10-23 NOTE — Progress Notes (Signed)
I,[name] as a Education administrator for [employer], FNP.,have documented all relevant documentation on the behalf of [name], FNP,as directed by  [name], FNP while in the presence of [name], [place].  This visit occurred during the SARS-CoV-2 public health emergency.  Safety protocols were in place, including screening questions prior to the visit, additional usage of staff PPE, and extensive cleaning of exam room while observing appropriate contact time as indicated for disinfecting solutions.  Subjective:     Patient ID: Joseph Olson , male    DOB: Dec 30, 1927 , 86 y.o.   MRN: [id number]   Chief Complaint  Patient presents with  . Annual Exam    HPI  Here for HM  Hyperlipidemia f/u - overall he is doing well with his medications and avoiding fried and fatty foods.  Hypertension This is a chronic problem. The current episode started more than 1 year ago. The problem is unchanged. The problem is controlled. Pertinent negatives include no anxiety, blurred vision, chest pain, headaches or palpitations. There are no associated agents to hypertension. There are no known risk factors for coronary artery disease. Past treatments include diuretics. The current treatment provides no improvement. There is no history of angina. There is no history of chronic renal disease.  Diabetes He presents for his follow-up diabetic visit. Diabetes type: prediabetes. His disease course has been stable. There are no hypoglycemic associated symptoms. Pertinent negatives for hypoglycemia include no confusion, dizziness or headaches. There are no diabetic associated symptoms. Pertinent negatives for diabetes include no blurred vision, no chest pain, no polydipsia, no polyphagia and no polyuria. There are no hypoglycemic complications. Symptoms are stable. There are no diabetic complications. Risk factors for coronary artery disease include sedentary lifestyle. Current diabetic treatment includes  oral agent (monotherapy). He is compliant with treatment all of the time. He has not had a previous visit with a dietitian. He rarely participates in exercise. (Does not check blood sugar regularly) An ACE inhibitor/angiotensin II receptor blocker is being taken.     Past Medical History:  Diagnosis Date  . Diabetes mellitus without complication ([place])      No family history on file.   Current Outpatient Medications:  .  atorvastatin (LIPITOR) 10 MG tablet, TAKE 1 TABLET EVERY DAY AT 6:00 PM, Disp: 90 tablet, Rfl: 1 .  CHANTIX CONTINUING MONTH PAK 1 MG tablet, TAKE 1 TABLET TWICE DAILY, Disp: 168 tablet, Rfl: 0 .  diclofenac Sodium (VOLTAREN) 1 % GEL, APPLY 2 GRAMS TOPICALLY 4 TIMES DAILY., Disp: 700 g, Rfl: 1 .  gabapentin (NEURONTIN) 100 MG capsule, Take 1 capsule (100 mg total) by mouth at bedtime., Disp: 90 capsule, Rfl: 1 .  hydrochlorothiazide (HYDRODIURIL) 12.5 MG tablet, Take 1 tablet (12.5 mg total) by mouth daily., Disp: 90 tablet, Rfl: 1 .  Magnesium 200 MG TABS, Take 1 tablet (200 mg total) by mouth daily. Take daily with your evening meal, Disp: 30 tablet, Rfl: 3 .  metFORMIN (GLUCOPHAGE-XR) 500 MG 24 hr tablet, TAKE 1 TABLET EVERY DAY WITH FOOD, Disp: 90 tablet, Rfl: 1 .  Omega-3 Fatty Acids (FISH OIL PO), Take 1 tablet by mouth daily., Disp: , Rfl:  .  OVER THE COUNTER MEDICATION, Arthritis cream, Disp: , Rfl:    No Known Allergies   Men's preventive visit. Patient Health Questionnaire (PHQ-2) is    Clinical Support from 10/23/2019 in [facility]  PHQ-2 Total Score 0     Patient is on a regular diet.  He is not exercising by walking at this time. Marital status: widower Relevant history for alcohol use is:  Social History   Substance and Sexual Activity  Alcohol Use Yes  . Alcohol/week: 2.0 standard drinks  . Types: 2 Cans of beer per week   Relevant history for tobacco use is:  Social History   Tobacco Use  Smoking Status Current Every Day  Smoker  . Packs/day: 0.50  . Years: 35.00  . Pack years: 17.50  Smokeless Tobacco Never Used  Tobacco Comment   he  .   Review of Systems  Constitutional: Negative.   HENT: Negative.   Eyes: Negative.  Negative for blurred vision.  Respiratory: Negative.   Cardiovascular: Negative.  Negative for chest pain and palpitations.  Gastrointestinal: Negative.   Endocrine: Negative.  Negative for polydipsia, polyphagia and polyuria.  Genitourinary: Negative.   Musculoskeletal: Negative.   Skin: Negative.   Allergic/Immunologic: Negative.   Neurological: Negative.  Negative for dizziness and headaches.  Hematological: Negative.   Psychiatric/Behavioral: Negative.  Negative for confusion.     Today's Vitals   10/23/19 1014  BP: 138/66  Pulse: (!) 102  Temp: (!) 97.4 F (36.3 C)  TempSrc: Oral  Weight: 159 lb (72.1 kg)  Height: 5' 7.8" (1.722 m)  PainSc: 0-No pain   Body mass index is 24.32 kg/m.   Objective:  Physical Exam Vitals reviewed.  Constitutional:      Appearance: Normal appearance. He is obese.  HENT:     Head: Normocephalic and atraumatic.     Right Ear: External ear normal. There is impacted cerumen (hard cerumen present).     Left Ear: External ear normal. There is impacted cerumen (hard cerumen present).     Nose:     Comments: Deferred - masked    Mouth/Throat:     Comments: Deferred - masked Eyes:     Pupils: Pupils are equal, round, and reactive to light.     Comments: Arcus senilis bilateral eyes     Cardiovascular:     Rate and Rhythm: Normal rate and regular rhythm.     Pulses: Normal pulses.     Heart sounds: Normal heart sounds. No murmur heard.   Pulmonary:     Effort: Pulmonary effort is normal. No respiratory distress.     Breath sounds: Normal breath sounds.  Abdominal:     General: Abdomen is flat. Bowel sounds are normal. There is no distension.     Palpations: Abdomen is soft.  Genitourinary:    Prostate: Normal.     Rectum:  Guaiac result negative.  Musculoskeletal:        General: Normal range of motion.     Cervical back: Normal range of motion and neck supple.  Feet:     Right foot:     Toenail Condition: Right toenails are abnormally thick.     Left foot:     Toenail Condition: Left toenails are abnormally thick.  Skin:    General: Skin is warm.     Capillary Refill: Capillary refill takes less than 2 seconds.  Neurological:     General: No focal deficit present.     Mental Status: He is alert and oriented to person, place, and time.  Psychiatric:        Mood and Affect: Mood normal.        Behavior: Behavior normal.        Thought Content: Thought content normal.        Judgment:  Judgment normal.         Assessment And Plan:    1. Routine medical exam  Pt's annual wellness exam was performed and geriatric assessment reviewed.   Pt has no new identiafble wellness concerns at this time.   WIll obtain routine labs.   Will obtain UA and micro.   Behavior modifications discussed and diet history reviewed. Pt will continue to exercise regularly and modify diet, with low GI, plant based foods and decrease food intake of processed foods.   Recommend intake of daily multivitamin, Vitamin D, and calcium.  Recommond for preventive screenings, as well as recommend immunizations that include influenza (will receive today) and TDAP (sent Rx to pharmacy)  2. Prediabetes  Chronic, stable  Continue with current medications  Encouraged to limit intake of sugary foods and drinks  He left prior to obtaining his urine sample stated his son had to go to work - CBC - Hemoglobin A1c - Lipid panel - POCT Urinalysis Dipstick (81002) - POCT UA - Microalbumin  3. Essential hypertension . B/P is controlled.  . CMP ordered to check renal function.  . EKG done with NSR - CBC - CMP14+EGFR - POCT Urinalysis Dipstick (81002) - POCT UA - Microalbumin - EKG 12-Lead  4. Nocturia  Prostate  normal  Will check PSA levels  5. Encounter for immunization - pneumococcal 13-valent conjugate vaccine (PREVNAR 13) SUSP injection; Inject 0.5 mLs into the muscle tomorrow at 10 am for 1 dose.  Dispense: 0.5 mL; Refill: 0 - Tdap (BOOSTRIX) 5-2.5-18.5 LF-MCG/0.5 injection; Inject 0.5 mLs into the muscle once for 1 dose.  Dispense: 0.5 mL; Refill: 0  6. Encounter for prostate cancer screening  7. Need for influenza vaccination  Influenza vaccine administered  Encouraged to take Tylenol as needed for fever or muscle aches.  8. Bilateral impacted cerumen  He did not stay to have his ears flushed     Patient was given opportunity to ask questions. Patient verbalized understanding of the plan and was able to repeat key elements of the plan. All questions were answered to their satisfaction.   [name], FNP   I, [name], FNP, have reviewed all documentation for this visit. The documentation on 10/29/19 for the exam, diagnosis, procedures, and orders are all accurate and complete.  THE PATIENT IS ENCOURAGED TO PRACTICE SOCIAL DISTANCING DUE TO THE COVID-19 PANDEMIC.

## 2019-10-23 NOTE — Progress Notes (Signed)
I connected with Joseph Olson today by telephone and verified that I am speaking with the correct person using two identifiers. Location patient: home Location provider: work Persons participating in the virtual visit: Joseph Olson, Glenna Durand LPN.   I discussed the limitations, risks, security and privacy concerns of performing an evaluation and management service by telephone and the availability of in person appointments. I also discussed with the patient that there may be a patient responsible charge related to this service. The patient expressed understanding and verbally consented to this telephonic visit.    Interactive audio and video telecommunications were attempted between this provider and patient, however failed, due to patient having technical difficulties OR patient did not have access to video capability.  We continued and completed visit with audio only.    Vital signs may be patient reported or missing.   Subjective:   Joseph Olson is a 86 y.o. male who presents for Medicare Annual/Subsequent preventive examination.  Review of Systems     Cardiac Risk Factors include: advanced age (>79men, >77 women);hypertension;male gender;sedentary lifestyle     Objective:    Today's Vitals   10/23/19 1520  Weight: 159 lb (72.1 kg)  Height: 5\' 8"  (1.727 m)   Body mass index is 24.18 kg/m.  Advanced Directives 10/23/2019 10/11/2018 04/21/2017  Does Patient Have a Medical Advance Directive? No Yes Yes  Type of Advance Directive - [place];Living will [place];Living will  Does patient want to make changes to medical advance directive? - - No - Patient declined  Copy of [place] in Chart? - No - copy requested No - copy requested    Current Medications (verified) Outpatient Encounter Medications as of 10/23/2019  Medication Sig  . atorvastatin (LIPITOR) 10 MG tablet TAKE 1 TABLET EVERY DAY AT 6:00 PM  . CHANTIX  CONTINUING MONTH PAK 1 MG tablet TAKE 1 TABLET TWICE DAILY  . diclofenac Sodium (VOLTAREN) 1 % GEL APPLY 2 GRAMS TOPICALLY 4 TIMES DAILY.  [name] gabapentin (NEURONTIN) 100 MG capsule Take 1 capsule (100 mg total) by mouth at bedtime.  . hydrochlorothiazide (HYDRODIURIL) 12.5 MG tablet Take 1 tablet (12.5 mg total) by mouth daily.  . Magnesium 200 MG TABS Take 1 tablet (200 mg total) by mouth daily. Take daily with your evening meal  . metFORMIN (GLUCOPHAGE-XR) 500 MG 24 hr tablet TAKE 1 TABLET EVERY DAY WITH FOOD  . Omega-3 Fatty Acids (FISH OIL PO) Take 1 tablet by mouth daily.  [name] OVER THE COUNTER MEDICATION Arthritis cream   No facility-administered encounter medications on file as of 10/23/2019.    Allergies (verified) Patient has no known allergies.   History: Past Medical History:  Diagnosis Date  . Diabetes mellitus without complication ([place])    History reviewed. No pertinent surgical history. History reviewed. No pertinent family history. Social History   Socioeconomic History  . Marital status: Married    Spouse name: Not on file  . Number of children: Not on file  . Years of education: Not on file  . Highest education level: Not on file  Occupational History  . Occupation: retired  Tobacco Use  . Smoking status: Current Every Day Smoker    Packs/day: 0.50    Years: 35.00    Pack years: 17.50  . Smokeless tobacco: Never Used  . Tobacco comment: he  Vaping Use  . Vaping Use: Never used  Substance and Sexual Activity  . Alcohol use: Yes  Alcohol/week: 2.0 standard drinks    Types: 2 Cans of beer per week  . Drug use: No  . Sexual activity: Not Currently  Other Topics Concern  . Not on file  Social History Narrative  . Not on file   Social Determinants of Health   Financial Resource Strain: Low Risk   . Difficulty of Paying Living Expenses: Not hard at all  Food Insecurity: No Food Insecurity  . Worried About Charity fundraiser in the Last Year: Never true    . Ran Out of Food in the Last Year: Never true  Transportation Needs: No Transportation Needs  . Lack of Transportation (Medical): No  . Lack of Transportation (Non-Medical): No  Physical Activity: Insufficiently Active  . Days of Exercise per Week: 7 days  . Minutes of Exercise per Session: 10 min  Stress: No Stress Concern Present  . Feeling of Stress : Not at all  Social Connections:   . Frequency of Communication with Friends and Family: Not on file  . Frequency of Social Gatherings with Friends and Family: Not on file  . Attends Religious Services: Not on file  . Active Member of Clubs or Organizations: Not on file  . Attends Archivist Meetings: Not on file  . Marital Status: Not on file    Tobacco Counseling Ready to quit: Not Answered Counseling given: Not Answered Comment: he   Clinical Intake:  Pre-visit preparation completed: Yes  Pain : No/denies pain     Nutritional Status: BMI of 19-24  Normal Nutritional Risks: None Diabetes: No  How often do you need to have someone help you when you read instructions, pamphlets, or other written materials from your doctor or pharmacy?: 1 - Never What is the last grade level you completed in school?: 5th grade  Diabetic? no  Interpreter Needed?: No  Information entered by :: NAllen LPN   Activities of Daily Living In your present state of health, do you have any difficulty performing the following activities: 10/23/2019 10/22/2019  Hearing? N N  Vision? N N  Difficulty concentrating or making decisions? N N  Walking or climbing stairs? N N  Dressing or bathing? N N  Doing errands, shopping? N N  Preparing Food and eating ? N -  Using the Toilet? N -  In the past six months, have you accidently leaked urine? Y -  Do you have problems with loss of bowel control? N -  Managing your Medications? N -  Managing your Finances? N -  Housekeeping or managing your Housekeeping? N -  Some recent data might be  hidden    Patient Care Team: [name], FNP as PCP - General ([place]) [place], Claudette Stapler, RN as [place] any recent [address] you may have received from other than [facility] providers in the past year (date may be approximate).     Assessment:   This is a routine wellness examination for [name].  Hearing/Vision screen  Hearing Screening   125Hz  250Hz  500Hz  1000Hz  2000Hz  3000Hz  4000Hz  6000Hz  8000Hz   Right ear:           Left ear:           Vision Screening Comments: Regular eye exams,   Dietary issues and exercise activities discussed: Current Exercise Habits: Home exercise routine, Type of exercise: stretching, Time (Minutes): 10, Frequency (Times/Week): 7, Weekly Exercise (Minutes/Week): 70  Goals    .  "I would like for  my legs to feel better" (pt-stated)      CARE PLAN ENTRY (see longtitudinal plan of care for additional care plan information)  Current Barriers:  [name] Knowledge Deficits related to evaluation and treatment for bilateral leg cramps/pain related to Neuropathy . Chronic Disease Management support and education needs related to Essential HTN, Prediabetes  Nurse Case Manager Clinical Goal(s):  [name] Over the next 90 days, patient will work with CCM RN CM and PCP to address needs related to disease education and support to improve Self Health management of Neuropathy  CCM RN CM Interventions:  06/18/19 call completed with patient  . Inbound call from patient  . Evaluation of current treatment plan related to Neuropathy and patient's adherence to plan as established by provider. . Determined patient f/u with PCP provider [name], FNP on 06/05/19 for further evaluation of persistent leg/foot cramping  Determined and reviewed with patient, PCP recommended him to take magnesium daily with evening meal and increase his water intake; . - Magnesium 200 MG TABS; Take 1 tablet (200 mg total) by mouth daily. Take daily  with your evening meal  Dispense: 30 tablet; Refill: 3 . Discussed patient has not received this medication due to it was sent to his [facility] mail order pharmacy . Instructed patient he can purchase this medication OTC if he prefers, in order to get started on the medication  . Discussed patient plans to have his stepson drive him to pharmacy, he will purchase this medication in small supply from [facility] until his mail order arrives  . Discussed plans with patient for ongoing care management follow up and provided patient with direct contact information for care management team  Patient Self Care Activities:  . Patient verbalizes understanding of plan to start Gabapentin 100 mg at bedtime for Neuropathy pain as soon as ready at pharmacy . Self administers medications as prescribed . Attends all scheduled provider appointments . Calls pharmacy for medication refills . Performs ADL's independently . Performs IADL's independently . Calls provider office for new concerns or questions  Please see past updates related to this goal by clicking on the "Past Updates" button in the selected goal      .  "to keep blood sugar good" (pt-stated)      [place] (see longtitudinal plan of care for additional care plan information)  Current Barriers:  [name] Knowledge Deficits related to disease process and Self Health management of DM . Chronic Disease Management support and education needs related to Prediabetes, Essential Hypertension   Nurse Case Manager Clinical Goal(s):  [name] Over the next 90 days, patient will work with CCM RN CM and PCP to address needs related to disease education and support for improved Self Health management of Prediabetes  CCM RN CM Interventions:  05/17/19 call completed with patient  . Evaluation of current treatment plan related to Prediabetes and patient's adherence to plan as established by provider. . Provided education to patient re: current A1c; educated on target  ranges and how to maintain and or lower current A1c with medication and diet adherence, staying active and implementing daily exercise regimen, discussed 150 minutes per week as tolerated . Reviewed medications with patient and discussed indication, frequency and dosage or prescribed Metformin 500 mg XR daily with breakfast . Discussed plans with patient for ongoing care management follow up and provided patient with direct contact information for care management team . Provided patient with printed educational materials related to Diabetes Management with Meal Planning; Carb Choices;  signs/symptoms of Hypoglycemia; Life's Simple 7  Patient Self Care Activities:  . Self administers medications as prescribed . Attends all scheduled provider appointments . Calls pharmacy for medication refills . Performs ADL's independently . Performs IADL's independently . Calls provider office for new concerns or questions  Initial goal documentation     .  "to keep my BP under good control" (pt-stated)      CARE PLAN ENTRY (see longtitudinal plan of care for additional care plan information)  Current Barriers:  [name] Knowledge Deficits related to disease process and Self Health management of HTN . Chronic Disease Management support and education needs related to Essential Hypertension and Prediabetes  Nurse Case Manager Clinical Goal(s):  [name] Over the next 90 days, patient will work with CCM RN CM and PCP to address needs related to disease education and support to improve Self Health management of HTN  CCM RN CM Interventions:  05/17/19 call completed with patient  . Evaluation of current treatment plan related to Essential HTN and patient's adherence to plan as established by provider. . Provided education to patient re: target BP <130/80; basic disease education with rationale on importance of controlling BP to help reduce associated complications . Reviewed medications with patient and discussed  importance of medication adherence; reviewed medication indication, dosage and frequency of prescribed medications . Discussed plans with patient for ongoing care management follow up and provided patient with direct contact information for care management team . Provided patient with printed educational materials related to What is High Blood Pressure?; African Americans and High Blood Pressure; Why Should I Restrict Sodium; High Blood Pressure and Stroke  Patient Self Care Activities:  . Self administers medications as prescribed . Attends all scheduled provider appointments . Calls pharmacy for medication refills . Performs ADL's independently . Performs IADL's independently . Calls provider office for new concerns or questions  Initial goal documentation     .  Patient Stated      10/11/2018, no goals    .  Patient Stated      10/23/2019, no goals      Depression Screen PHQ 2/9 Scores 10/23/2019 06/04/2019 04/22/2019 01/22/2019 10/18/2018 10/11/2018  PHQ - 2 Score 0 0 0 0 0 0  PHQ- 9 Score 0 - - - - 0    Fall Risk Fall Risk  10/23/2019 06/04/2019 04/22/2019 01/22/2019 10/18/2018  Falls in the past year? 0 0 0 0 0  Risk for fall due to : Impaired balance/gait;Medication side effect - - - -  Follow up Falls evaluation completed;Education provided;Falls prevention discussed - - - -    Any stairs in or around the home? No  If so, are there any without handrails? n/a Home free of loose throw rugs in walkways, pet beds, electrical cords, etc? Yes  Adequate lighting in your home to reduce risk of falls? Yes   ASSISTIVE DEVICES UTILIZED TO PREVENT FALLS:  Life alert? Yes  Use of a cane, walker or w/c? Yes  Grab bars in the bathroom? Yes  Shower chair or bench in shower? Yes  Elevated toilet seat or a handicapped toilet? Yes   TIMED UP AND GO:  Was the test performed? No . .    Cognitive Function:     6CIT Screen 10/23/2019 10/11/2018  What Year? 0 points 0 points  What month? 0  points 0 points  What time? 0 points 0 points  Count back from 20 4 points 0 points  Months in reverse 4 points  0 points  Repeat phrase 2 points 0 points  Total Score 10 0    Immunizations Immunization History  Administered Date(s) Administered  . Influenza, High Dose Seasonal PF 01/22/2019  . Moderna SARS-COVID-2 Vaccination 05/21/2019, 06/18/2019    TDAP status: Due, Education has been provided regarding the importance of this vaccine. Advised may receive this vaccine at local pharmacy or Health Dept. Aware to provide a copy of the vaccination record if obtained from local pharmacy or Health Dept. Verbalized acceptance and understanding. Flu Vaccine status: Up to date Pneumococcal vaccine status: Sent to pharmacy Covid-19 vaccine status: Completed vaccines  Qualifies for Shingles Vaccine? Yes   Zostavax completed No   Shingrix Completed?: No.    Education has been provided regarding the importance of this vaccine. Patient has been advised to call insurance company to determine out of pocket expense if they have not yet received this vaccine. Advised may also receive vaccine at local pharmacy or Health Dept. Verbalized acceptance and understanding.  Screening Tests Health Maintenance  Topic Date Due  . TETANUS/TDAP  Never done  . PNA vac Low Risk Adult (1 of 2 - PCV13) Never done  . INFLUENZA VACCINE  09/22/2019  . URINE MICROALBUMIN  10/18/2019  . COVID-19 Vaccine  Completed    Health Maintenance  Health Maintenance Due  Topic Date Due  . TETANUS/TDAP  Never done  . PNA vac Low Risk Adult (1 of 2 - PCV13) Never done  . INFLUENZA VACCINE  09/22/2019  . URINE MICROALBUMIN  10/18/2019    Colorectal cancer screening: No longer required.   Lung Cancer Screening: (Low Dose CT Chest recommended if Age 66-80 years, 30 pack-year currently smoking OR have quit w/in 15years.) does not qualify.   Lung Cancer Screening Referral: no  Additional Screening:  Hepatitis C  Screening: does not qualify;   Vision Screening: Recommended annual ophthalmology exams for early detection of glaucoma and other disorders of the eye. Is the patient up to date with their annual eye exam?  Yes  Who is the provider or what is the name of the office in which the patient attends annual eye exams? Doesn't remember If pt is not established with a provider, would they like to be referred to a provider to establish care? No .   Dental Screening: Recommended annual dental exams for proper oral hygiene  Community Resource Referral / Chronic Care Management: CRR required this visit?  No   CCM required this visit?  No      Plan:     I have personally reviewed and noted the following in the patient's chart:   . Medical and social history . Use of alcohol, tobacco or illicit drugs  . Current medications and supplements . Functional ability and status . Nutritional status . Physical activity . Advanced directives . List of other physicians . Hospitalizations, surgeries, and ER visits in previous 12 months . Vitals . Screenings to include cognitive, depression, and falls . Referrals and appointments  In addition, I have reviewed and discussed with patient certain preventive protocols, quality metrics, and best practice recommendations. A written personalized care plan for preventive services as well as general preventive health recommendations were provided to patient.     Kellie Simmering, LPN   0/09/1446   Nurse Notes:

## 2019-10-24 ENCOUNTER — Telehealth: Payer: Self-pay

## 2019-10-25 LAB — CMP14+EGFR
ALT: 7 IU/L (ref 0–44)
AST: 18 IU/L (ref 0–40)
Albumin/Globulin Ratio: 1.1 — ABNORMAL LOW (ref 1.2–2.2)
Albumin: 3.7 g/dL (ref 3.5–4.6)
Alkaline Phosphatase: 87 IU/L (ref 48–121)
BUN/Creatinine Ratio: 24 (ref 10–24)
BUN: 17 mg/dL (ref 10–36)
Bilirubin Total: 0.2 mg/dL (ref 0.0–1.2)
CO2: 22 mmol/L (ref 20–29)
Calcium: 9.2 mg/dL (ref 8.6–10.2)
Chloride: 97 mmol/L (ref 96–106)
Creatinine, Ser: 0.7 mg/dL — ABNORMAL LOW (ref 0.76–1.27)
GFR calc Af Amer: 96 mL/min/{1.73_m2} (ref >59)
GFR calc non Af Amer: 83 mL/min/{1.73_m2} (ref >59)
Globulin, Total: 3.5 g/dL (ref 1.5–4.5)
Glucose: 134 mg/dL — ABNORMAL HIGH (ref 65–99)
Potassium: 3.9 mmol/L (ref 3.5–5.2)
Sodium: 137 mmol/L (ref 134–144)
Total Protein: 7.2 g/dL (ref 6.0–8.5)

## 2019-10-25 LAB — LIPID PANEL
Chol/HDL Ratio: 2.9 ratio (ref 0.0–5.0)
Cholesterol, Total: 135 mg/dL (ref 100–199)
HDL: 47 mg/dL (ref >39)
LDL Chol Calc (NIH): 72 mg/dL (ref 0–99)
Triglycerides: 80 mg/dL (ref 0–149)
VLDL Cholesterol Cal: 16 mg/dL (ref 5–40)

## 2019-10-25 LAB — HEMOGLOBIN A1C
Est. average glucose Bld gHb Est-mCnc: 140 mg/dL
Hgb A1c MFr Bld: 6.5 % — ABNORMAL HIGH (ref 4.8–5.6)

## 2019-10-25 LAB — CBC
Hematocrit: 32.8 % — ABNORMAL LOW (ref 37.5–51.0)
Hemoglobin: 10.2 g/dL — ABNORMAL LOW (ref 13.0–17.7)
MCH: 28.9 pg (ref 26.6–33.0)
MCHC: 31.1 g/dL — ABNORMAL LOW (ref 31.5–35.7)
MCV: 93 fL (ref 79–97)
Platelets: 324 10*3/uL (ref 150–450)
RBC: 3.53 x10E6/uL — ABNORMAL LOW (ref 4.14–5.80)
RDW: 12.8 % (ref 11.6–15.4)
WBC: 6.8 10*3/uL (ref 3.4–10.8)

## 2019-10-29 ENCOUNTER — Encounter: Payer: Self-pay | Admitting: Nurse Practitioner

## 2019-11-01 ENCOUNTER — Telehealth: Payer: Self-pay

## 2019-11-05 ENCOUNTER — Other Ambulatory Visit (INDEPENDENT_AMBULATORY_CARE_PROVIDER_SITE_OTHER): Payer: Medicare HMO

## 2019-11-05 DIAGNOSIS — Z23 Encounter for immunization: Secondary | ICD-10-CM

## 2019-12-03 ENCOUNTER — Telehealth: Payer: Self-pay

## 2019-12-17 ENCOUNTER — Telehealth: Payer: Medicare HMO

## 2019-12-17 ENCOUNTER — Encounter: Payer: Self-pay | Admitting: Nurse Practitioner

## 2019-12-17 ENCOUNTER — Other Ambulatory Visit: Payer: Self-pay

## 2019-12-17 ENCOUNTER — Ambulatory Visit: Payer: Self-pay

## 2019-12-17 ENCOUNTER — Ambulatory Visit (INDEPENDENT_AMBULATORY_CARE_PROVIDER_SITE_OTHER): Payer: Medicare HMO | Admitting: Nurse Practitioner

## 2019-12-17 VITALS — BP 132/68 | HR 104 | Temp 98.4°F | Ht 68.0 in | Wt 159.0 lb

## 2019-12-17 DIAGNOSIS — M545 Low back pain, unspecified: Secondary | ICD-10-CM | POA: Diagnosis not present

## 2019-12-17 DIAGNOSIS — R1012 Left upper quadrant pain: Secondary | ICD-10-CM

## 2019-12-17 DIAGNOSIS — I10 Essential (primary) hypertension: Secondary | ICD-10-CM

## 2019-12-17 DIAGNOSIS — R7303 Prediabetes: Secondary | ICD-10-CM

## 2019-12-17 MED ORDER — DICLOFENAC SODIUM 1 % EX GEL: 2.0000 g | Freq: Four times a day (QID) | CUTANEOUS | 2 refills | Status: AC

## 2019-12-17 NOTE — Progress Notes (Signed)
I,[name] as a Education administrator for [employer], FNP.,have documented all relevant documentation on the behalf of [name], FNP,as directed by  [name], FNP while in the presence of [name], [name]. This visit occurred during the SARS-CoV-2 public health emergency.  Safety protocols were in place, including screening questions prior to the visit, additional usage of staff PPE, and extensive cleaning of exam room while observing appropriate contact time as indicated for disinfecting solutions.  Subjective:     Patient ID: Joseph Olson , male    DOB: [birth year] , 86 y.o.   MRN: [id number]   Chief Complaint  Patient presents with  . Abdominal Pain    patient stated he is having pain in his left lower abdomen and he also stated when he lays on his left side the pain goes away    HPI  He is here today due to having sharp left side pain.  He has been urinating regularly. He does report he has to strain to have a bowel movement.  He admits to not drinking enough water will drink soda.     He is here today with his son.  He is moving a little more slowly than his usual.     Past Medical History:  Diagnosis Date  . Diabetes mellitus without complication ([place])      History reviewed. No pertinent family history.   Current Outpatient Medications:  .  CHANTIX CONTINUING MONTH PAK 1 MG tablet, TAKE 1 TABLET TWICE DAILY, Disp: 168 tablet, Rfl: 0 .  diclofenac Sodium (VOLTAREN) 1 % GEL, APPLY 2 GRAMS TOPICALLY 4 TIMES DAILY., Disp: 700 g, Rfl: 1 .  gabapentin (NEURONTIN) 100 MG capsule, Take 1 capsule (100 mg total) by mouth at bedtime., Disp: 90 capsule, Rfl: 1 .  hydrochlorothiazide (HYDRODIURIL) 12.5 MG tablet, Take 1 tablet (12.5 mg total) by mouth daily., Disp: 90 tablet, Rfl: 1 .  Magnesium 200 MG TABS, Take 1 tablet (200 mg total) by mouth daily. Take daily with your evening meal, Disp: 30 tablet, Rfl: 3 .  metFORMIN (GLUCOPHAGE-XR) 500 MG 24 hr tablet, TAKE 1 TABLET  EVERY DAY WITH FOOD, Disp: 90 tablet, Rfl: 1 .  Omega-3 Fatty Acids (FISH OIL PO), Take 1 tablet by mouth daily., Disp: , Rfl:  .  OVER THE COUNTER MEDICATION, Arthritis cream, Disp: , Rfl:  .  atorvastatin (LIPITOR) 40 MG tablet, Take 1 tablet (40 mg total) by mouth daily., Disp: 90 tablet, Rfl: 1 .  diclofenac Sodium (VOLTAREN) 1 % GEL, Apply 2 g topically 4 (four) times daily., Disp: 100 g, Rfl: 2   No Known Allergies   Review of Systems  Constitutional: Negative.   Respiratory: Negative.   Cardiovascular: Negative.   Genitourinary: Negative.   Musculoskeletal: Positive for back pain.  Skin: Negative.   Neurological: Negative.  Negative for dizziness and headaches.  Psychiatric/Behavioral: Negative.      Today's Vitals   12/17/19 1448  BP: 132/68  Pulse: (!) 104  Temp: 98.4 F (36.9 C)  TempSrc: Oral  Weight: 158 lb 15.2 oz (72.1 kg)  Height: 5\' 8"  (1.727 m)  PainSc: 2   PainLoc: Abdomen   Body mass index is 24.17 kg/m.   Objective:  Physical Exam Vitals reviewed.  Constitutional:      General: He is not in acute distress.    Appearance: He is well-developed.  Cardiovascular:     Heart sounds: Normal heart sounds. No murmur heard.   Pulmonary:  Effort: Pulmonary effort is normal. No respiratory distress.     Breath sounds: Normal breath sounds.  Abdominal:     General: Bowel sounds are normal.     Palpations: Abdomen is soft.     Tenderness: There is abdominal tenderness (left upper abdomen).  Musculoskeletal:     Lumbar back: Tenderness (bilateral lower back ) present. No swelling or edema.  Skin:    General: Skin is warm.     Capillary Refill: Capillary refill takes less than 2 seconds.  Neurological:     General: No focal deficit present.     Mental Status: He is alert and oriented to person, place, and time.         Assessment And Plan:     1. Left upper quadrant abdominal pain  Will check abdomen xray since this has been ongoing. Will  evaluate for structural damage may be related to constipation as he admits to not drinking an adequate amount of water.  He is encouraged to take a stool softner daily - DG Abd 1 View; Future  2. Acute left-sided low back pain without sciatica  Will check lumbar spine xray, offered to send to physical therapy however he declines at this time  Will provide him with a pain cream - DG Lumbar Spine Complete; Future - diclofenac Sodium (VOLTAREN) 1 % GEL; Apply 2 g topically 4 (four) times daily.  Dispense: 100 g; Refill: 2     Patient was given opportunity to ask questions. Patient verbalized understanding of the plan and was able to repeat key elements of the plan. All questions were answered to their satisfaction.   Teola Bradley, FNP, have reviewed all documentation for this visit. The documentation on 01/01/20 for the exam, diagnosis, procedures, and orders are all accurate and complete.   THE PATIENT IS ENCOURAGED TO PRACTICE SOCIAL DISTANCING DUE TO THE COVID-19 PANDEMIC.

## 2019-12-18 NOTE — Chronic Care Management (AMB) (Signed)
Chronic Care Management   Follow Up Note   12/17/2019 Name: Joseph Olson MRN: [id number] DOB: [birth year]  Referred by: [name], FNP Reason for referral : Chronic Care Management (Inbound Call from patient )   Joseph Olson is a 86 y.o. year old male who is a primary care patient of [name], [place]. The CCM team was consulted for assistance with chronic disease management and care coordination needs.    Review of patient status, including review of consultants reports, relevant laboratory and other test results, and collaboration with appropriate care team members and the patient's provider was performed as part of comprehensive patient evaluation and provision of chronic care management services.    SDOH (Social Determinants of Health) assessments performed: No See Care Plan activities for detailed interventions related to [place])   Inbound call from patient requesting to be evaluated and treated by PCP for acute pain to his abdomen and lower back.     Outpatient Encounter Medications as of 12/17/2019  Medication Sig  . atorvastatin (LIPITOR) 10 MG tablet TAKE 1 TABLET EVERY DAY AT 6:00 PM  . CHANTIX CONTINUING MONTH PAK 1 MG tablet TAKE 1 TABLET TWICE DAILY  . diclofenac Sodium (VOLTAREN) 1 % GEL APPLY 2 GRAMS TOPICALLY 4 TIMES DAILY.  [name] diclofenac Sodium (VOLTAREN) 1 % GEL Apply 2 g topically 4 (four) times daily.  [name] gabapentin (NEURONTIN) 100 MG capsule Take 1 capsule (100 mg total) by mouth at bedtime.  . hydrochlorothiazide (HYDRODIURIL) 12.5 MG tablet Take 1 tablet (12.5 mg total) by mouth daily.  . Magnesium 200 MG TABS Take 1 tablet (200 mg total) by mouth daily. Take daily with your evening meal  . metFORMIN (GLUCOPHAGE-XR) 500 MG 24 hr tablet TAKE 1 TABLET EVERY DAY WITH FOOD  . Omega-3 Fatty Acids (FISH OIL PO) Take 1 tablet by mouth daily.  [name] OVER THE COUNTER MEDICATION Arthritis cream   No facility-administered encounter medications on file as of 12/17/2019.       Objective:  Lab Results  Component Value Date   HGBA1C 6.5 (H) 10/23/2019   HGBA1C 5.8 (H) 04/22/2019   HGBA1C 6.1 (H) 10/11/2018   Lab Results  Component Value Date   MICROALBUR 10 10/18/2018   LDLCALC 72 10/23/2019   CREATININE 0.70 (L) 10/23/2019   BP Readings from Last 3 Encounters:  12/17/19 132/68  10/23/19 138/66  07/24/19 130/72    Goals Addressed      Patient Stated   .  "to have this pain checked out" (pt-stated)        [place] (see longitudinal plan of care for additional care plan information)  Current Barriers:  [name] Knowledge Deficits related to evaluation and treatment of acute pain to abdomen and lower back . Chronic Disease Management support and education needs related to Essential Hypertension, Prediabetes  Nurse Case Manager Clinical Goal(s):  [name] Over the next 7 days, patient will work with PCP to address needs related to evaluate and treat acute pain to abdomen and lower back   CCM RN CM Interventions:  12/17/19 Inbound call completed with patient  . Inter-disciplinary care team collaboration (see longitudinal plan of care) . Inbound call received from patient with c/o acute pain to his abdomen and lower back that started a couple of days ago and has worsened, patient requesting to have his symptoms evaluated and treated by PCP ASAP  . Discussed plans with patient for ongoing care management follow up and provided patient with direct contact information for  care management team . Reviewed scheduled/upcoming provider appointments including: 12/17/19 @2 :30 PM   Patient Self Care Activities:  . Self administers medications as prescribed . Attends all scheduled provider appointments . Calls pharmacy for medication refills . Calls provider office for new concerns or questions  Initial goal documentation       Plan:   Telephone follow up appointment with care management team member scheduled for: 12/30/19 Follow up with provider re: acute  pain  Barb Merino, RN, BSN, CCM Care Management Coordinator [place] [facility]  Direct Phone: [phone]

## 2019-12-18 NOTE — Patient Instructions (Signed)
Visit Information  Goals Addressed      Patient Stated     "to have this pain checked out" (pt-stated)        CARE PLAN ENTRY (see longitudinal plan of care for additional care plan information)  Current Barriers:   Knowledge Deficits related to evaluation and treatment of acute pain to abdomen and lower back  Chronic Disease Management support and education needs related to Essential Hypertension, Prediabetes  Nurse Case Manager Clinical Goal(s):   Over the next 7 days, patient will work with PCP to address needs related to evaluate and treat acute pain to abdomen and lower back   CCM RN CM Interventions:  12/17/19 Inbound call completed with patient   Inter-disciplinary care team collaboration (see longitudinal plan of care)  Inbound call received from patient with c/o acute pain to his abdomen and lower back that started a couple of days ago and has worsened, patient requesting to have his symptoms evaluated and treated by PCP ASAP   Discussed plans with patient for ongoing care management follow up and provided patient with direct contact information for care management team  Reviewed scheduled/upcoming provider appointments including: 12/17/19 @2 :30 PM   Patient Self Care Activities:   Self administers medications as prescribed  Attends all scheduled provider appointments  Calls pharmacy for medication refills  Calls provider office for new concerns or questions  Initial goal documentation       Patient verbalizes understanding of instructions provided today.   Telephone follow up appointment with care management team member scheduled for: 12/30/19 Follow up with provider re: acute pain   Barb Merino, RN, BSN, CCM Care Management Coordinator [place] [facility]  Direct Phone: [phone]

## 2019-12-19 ENCOUNTER — Other Ambulatory Visit: Payer: Self-pay

## 2019-12-19 ENCOUNTER — Ambulatory Visit
Admission: RE | Admit: 2019-12-19 | Discharge: 2019-12-19 | Disposition: A | Discharge status: Home / Self Care | Payer: Medicare HMO | Source: Ambulatory Visit | Attending: Nurse Practitioner | Admitting: Nurse Practitioner

## 2019-12-19 DIAGNOSIS — R109 Unspecified abdominal pain: Secondary | ICD-10-CM | POA: Diagnosis not present

## 2019-12-19 DIAGNOSIS — R1012 Left upper quadrant pain: Secondary | ICD-10-CM

## 2019-12-19 DIAGNOSIS — M545 Low back pain, unspecified: Secondary | ICD-10-CM | POA: Diagnosis not present

## 2019-12-23 ENCOUNTER — Other Ambulatory Visit: Payer: Self-pay | Admitting: Nurse Practitioner

## 2019-12-23 ENCOUNTER — Telehealth: Payer: Self-pay

## 2019-12-23 DIAGNOSIS — R7303 Prediabetes: Secondary | ICD-10-CM

## 2019-12-23 MED ORDER — ATORVASTATIN CALCIUM 40 MG PO TABS: 40.0000 mg | ORAL_TABLET | Freq: Every day | ORAL | 1 refills | Status: DC

## 2019-12-23 NOTE — Telephone Encounter (Cosign Needed)
  Chronic Care Management   Outreach Note  12/23/2019 Name: Joseph Olson MRN: [id number] DOB: 1927-11-01  Referred by: [name], FNP Reason for referral : Chronic Care Management (Inbound Call from patient )   Received voice message from patient requesting a call back to review his xray results from last week. The patient was referred to the case management team for assistance with care management and care coordination. Sent in basket message to PCP provider [name], FNP advising of patient's voice message. Doreene Burke has reviewed his results and will have her CMA give him a call with recommendations.   Follow Up Plan: Telephone follow up appointment with care management team member scheduled for: 12/30/19  Barb Merino, RN, BSN, CCM Care Management Coordinator [place] [facility]  Direct Phone: [phone]

## 2019-12-30 ENCOUNTER — Other Ambulatory Visit: Payer: Self-pay

## 2019-12-30 ENCOUNTER — Ambulatory Visit: Payer: Self-pay

## 2019-12-30 ENCOUNTER — Telehealth: Payer: Medicare HMO

## 2019-12-30 DIAGNOSIS — R7303 Prediabetes: Secondary | ICD-10-CM

## 2019-12-30 DIAGNOSIS — I10 Essential (primary) hypertension: Secondary | ICD-10-CM

## 2019-12-31 NOTE — Patient Instructions (Signed)
Visit Information  Goals Addressed      Patient Stated   .  "to have this pain checked out" (pt-stated)        CARE PLAN ENTRY (see longitudinal plan of care for additional care plan information)  Current Barriers:  [name] Knowledge Deficits related to evaluation and treatment of acute pain to abdomen and lower back . Chronic Disease Management support and education needs related to Essential Hypertension, Prediabetes  Nurse Case Manager Clinical Goal(s):  [name] Over the next 7 days, patient will work with PCP to address needs related to evaluate and treat acute pain to abdomen and lower back Goal Met  . New 12/30/19 Over the next 90 days, patient will work with the CCM team and PCP for disease education and support for improved Self Health management of arthritic pain and impaired physical mobility   CCM RN CM Interventions:  12/30/19 call completed with patient  . Inter-disciplinary care team collaboration (see longitudinal plan of care) . Determined patient completed his OV with PCP [name], FNP to have his abdominal and lumbar pain evaluated . Reviewed and discussed PCP recommendations for treatment of musculoskeletal pain and xray indicated constipation and xray indicated atherosclerosis  . Determined patient will start an OTC stool softener daily, he will add prune juice to his daily regimen as needed, he will proceed with starting in home PT for treatment of arthritic pain and Scoliosis  . Educated patient on PCP recommendation to increase Atorvastatin to 40 mg q hs for increased atherosclerosis determined by xray, pt verbalizes understanding   . Collaborated with PCP regarding patient's request to start in home PT; requested Atorvastatin be resent to [employer]  . Discussed plans with patient for ongoing care management follow up and provided patient with direct contact information for care management team  Patient Self Care Activities:  . Self administers medications as  prescribed . Attends all scheduled provider appointments . Calls pharmacy for medication refills . Calls provider office for new concerns or questions  Please see past updates related to this goal by clicking on the "Past Updates" button in the selected goal        Patient verbalizes understanding of instructions provided today.   Telephone follow up appointment with care management team member scheduled for:  01/29/20  Barb Merino, RN, BSN, CCM Care Management Coordinator [place] [facility]  Direct Phone: [phone]

## 2019-12-31 NOTE — Chronic Care Management (AMB) (Signed)
Chronic Care Management   Follow Up Note   12/30/2019 Name: Joseph Olson MRN: [id number] DOB: [birth year]  Referred by: [name], FNP Reason for referral : Chronic Care Management (CCM RNCM FU Call )   Joseph Olson is a 86 y.o. year old male who is a primary care patient of [name], [place]. The CCM team was consulted for assistance with chronic disease management and care coordination needs.    Review of patient status, including review of consultants reports, relevant laboratory and other test results, and collaboration with appropriate care team members and the patient's provider was performed as part of comprehensive patient evaluation and provision of chronic care management services.    SDOH (Social Determinants of Health) assessments performed: Yes - no acute challenges identified  See Care Plan activities for detailed interventions related to [place])   Placed CCM RN CM follow up outbound call to patient for a care plan update.    Outpatient Encounter Medications as of 12/30/2019  Medication Sig  . atorvastatin (LIPITOR) 40 MG tablet Take 1 tablet (40 mg total) by mouth daily.  . CHANTIX CONTINUING MONTH PAK 1 MG tablet TAKE 1 TABLET TWICE DAILY  . diclofenac Sodium (VOLTAREN) 1 % GEL APPLY 2 GRAMS TOPICALLY 4 TIMES DAILY.  [name] diclofenac Sodium (VOLTAREN) 1 % GEL Apply 2 g topically 4 (four) times daily.  [name] gabapentin (NEURONTIN) 100 MG capsule Take 1 capsule (100 mg total) by mouth at bedtime.  . hydrochlorothiazide (HYDRODIURIL) 12.5 MG tablet Take 1 tablet (12.5 mg total) by mouth daily.  . Magnesium 200 MG TABS Take 1 tablet (200 mg total) by mouth daily. Take daily with your evening meal  . metFORMIN (GLUCOPHAGE-XR) 500 MG 24 hr tablet TAKE 1 TABLET EVERY DAY WITH FOOD  . Omega-3 Fatty Acids (FISH OIL PO) Take 1 tablet by mouth daily.  [name] OVER THE COUNTER MEDICATION Arthritis cream   No facility-administered encounter medications on file as of 12/30/2019.      Objective:  Lab Results  Component Value Date   HGBA1C 6.5 (H) 10/23/2019   HGBA1C 5.8 (H) 04/22/2019   HGBA1C 6.1 (H) 10/11/2018   Lab Results  Component Value Date   MICROALBUR 10 10/18/2018   LDLCALC 72 10/23/2019   CREATININE 0.70 (L) 10/23/2019   BP Readings from Last 3 Encounters:  12/17/19 132/68  10/23/19 138/66  07/24/19 130/72    Goals Addressed      Patient Stated   .  "to have this pain checked out" (pt-stated)        [place] (see longitudinal plan of care for additional care plan information)  Current Barriers:  [name] Knowledge Deficits related to evaluation and treatment of acute pain to abdomen and lower back . Chronic Disease Management support and education needs related to Essential Hypertension, Prediabetes  Nurse Case Manager Clinical Goal(s):  [name] Over the next 7 days, patient will work with PCP to address needs related to evaluate and treat acute pain to abdomen and lower back Goal Met  . New 12/30/19 Over the next 90 days, patient will work with the CCM team and PCP for disease education and support for improved Self Health management of arthritic pain and impaired physical mobility   CCM RN CM Interventions:  12/30/19 call completed with patient  . Inter-disciplinary care team collaboration (see longitudinal plan of care) . Determined patient completed his OV with PCP [name], FNP to have his abdominal and lumbar pain evaluated . Reviewed and discussed  PCP recommendations for treatment of musculoskeletal pain and xray indicated constipation and xray indicated atherosclerosis  . Determined patient will start an OTC stool softener daily, he will add prune juice to his daily regimen as needed, he will proceed with starting in home PT for treatment of arthritic pain and Scoliosis  . Educated patient on PCP recommendation to increase Atorvastatin to 40 mg q hs for increased atherosclerosis determined by xray, pt verbalizes understanding    . Collaborated with PCP regarding patient's request to start in home PT; requested Atorvastatin be resent to [employer]  . Discussed plans with patient for ongoing care management follow up and provided patient with direct contact information for care management team  Patient Self Care Activities:  . Self administers medications as prescribed . Attends all scheduled provider appointments . Calls pharmacy for medication refills . Calls provider office for new concerns or questions  Please see past updates related to this goal by clicking on the "Past Updates" button in the selected goal        Plan:   Telephone follow up appointment with care management team member scheduled for: 01/29/20  Barb Merino, RN, BSN, CCM Care Management Coordinator [place] [facility]  Direct Phone: [phone]

## 2020-01-01 ENCOUNTER — Encounter: Payer: Self-pay | Admitting: Nurse Practitioner

## 2020-01-06 ENCOUNTER — Other Ambulatory Visit: Payer: Self-pay | Admitting: Nurse Practitioner

## 2020-01-06 ENCOUNTER — Ambulatory Visit: Payer: Self-pay

## 2020-01-06 ENCOUNTER — Other Ambulatory Visit: Payer: Self-pay

## 2020-01-06 ENCOUNTER — Telehealth: Payer: Medicare HMO

## 2020-01-06 DIAGNOSIS — I10 Essential (primary) hypertension: Secondary | ICD-10-CM

## 2020-01-06 DIAGNOSIS — M545 Low back pain, unspecified: Secondary | ICD-10-CM

## 2020-01-06 DIAGNOSIS — R1012 Left upper quadrant pain: Secondary | ICD-10-CM

## 2020-01-06 DIAGNOSIS — R7303 Prediabetes: Secondary | ICD-10-CM

## 2020-01-07 NOTE — Chronic Care Management (AMB) (Signed)
Chronic Care Management   Follow Up Note   01/06/2020 Name: Jonnathan Birman MRN: [id number] DOB: Jun 11, 1927  Referred by: [name], FNP Reason for referral : Chronic Care Management (Inbound Call from patient )   Raesean Bartoletti is a 86 y.o. year old male who is a primary care patient of [name], [place]. The CCM team was consulted for assistance with chronic disease management and care coordination needs.    Review of patient status, including review of consultants reports, relevant laboratory and other test results, and collaboration with appropriate care team members and the patient's provider was performed as part of comprehensive patient evaluation and provision of chronic care management services.    SDOH (Social Determinants of Health) assessments performed: Yes - no acute challenges  See Care Plan activities for detailed interventions related to [facility])   Completed inbound call with patient and son Montine Circle.     Outpatient Encounter Medications as of 01/06/2020  Medication Sig  . atorvastatin (LIPITOR) 10 MG tablet TAKE 1 TABLET (10 MG TOTAL) BY MOUTH DAILY AT 6 PM.  . atorvastatin (LIPITOR) 40 MG tablet Take 1 tablet (40 mg total) by mouth daily.  . CHANTIX CONTINUING MONTH PAK 1 MG tablet TAKE 1 TABLET TWICE DAILY  . diclofenac Sodium (VOLTAREN) 1 % GEL APPLY 2 GRAMS TOPICALLY 4 TIMES DAILY.  [name] diclofenac Sodium (VOLTAREN) 1 % GEL Apply 2 g topically 4 (four) times daily.  [name] gabapentin (NEURONTIN) 100 MG capsule Take 1 capsule (100 mg total) by mouth at bedtime.  . hydrochlorothiazide (HYDRODIURIL) 12.5 MG tablet Take 1 tablet (12.5 mg total) by mouth daily.  . Magnesium 200 MG TABS Take 1 tablet (200 mg total) by mouth daily. Take daily with your evening meal  . metFORMIN (GLUCOPHAGE-XR) 500 MG 24 hr tablet TAKE ONE TABLET BY MOUTH DAILY  . Omega-3 Fatty Acids (FISH OIL PO) Take 1 tablet by mouth daily.  [name] OVER THE COUNTER MEDICATION Arthritis cream   No  facility-administered encounter medications on file as of 01/06/2020.     Objective:  Lab Results  Component Value Date   HGBA1C 6.5 (H) 10/23/2019   HGBA1C 5.8 (H) 04/22/2019   HGBA1C 6.1 (H) 10/11/2018   Lab Results  Component Value Date   MICROALBUR 10 10/18/2018   LDLCALC 72 10/23/2019   CREATININE 0.70 (L) 10/23/2019   BP Readings from Last 3 Encounters:  12/17/19 132/68  10/23/19 138/66  07/24/19 130/72    Goals Addressed      Patient Stated   .  "I would like for my legs to feel better" (pt-stated)        [place] (see longtitudinal plan of care for additional care plan information)  Current Barriers:  [name] Knowledge Deficits related to evaluation and treatment for chronic pain to lower extremities . Chronic Disease Management support and education needs related to Essential HTN, Prediabetes  Nurse Case Manager Clinical Goal(s):  . New 01/06/20 Over the next 90 days, patient will work with the CCM team and PCP to address needs related to disease education and support to improve Self Health management of chronic pain to lower extremities  CCM RN CM Interventions:  01/06/20 Inbound call completed with patient and son Montine Circle . Determined patient's son has questions about the outcome of Mr. Sortino's recent xray to evaluate the increased pain to his lower extremities . Determined patient f/u with PCP provider [name], FNP on 12/19/19 to have this symptom evaluated . Reviewed and discussed the  following xray results with son and patient following review by PCP:  o FINDINGS: o Frontal, bilateral oblique, lateral views of the lumbar spine o demonstrate 5 non-rib-bearing lumbar type vertebral bodies with mild o right convex scoliosis centered at L1/L2. Otherwise alignment is o anatomic. There is diffuse facet hypertrophy greatest at the o lumbosacral junction. Disc space height is relatively well o preserved. Sacroiliac joints are normal. There is  extensive o atherosclerosis of the aorta and its distal branches. o IMPRESSION: o 1. Mild right convex scoliosis. o 2. Prominent lower lumbar facet hypertrophy. o 3. Extensive atherosclerosis. . Informed son PCP has recommended PT and patient is agreeable . Collaborated with PCP via in basket message to request in home PT referral be sent . Discussed plans with patient for ongoing care management follow up and provided patient with direct contact information for care management team  Patient Self Care Activities:  . Self administers medications as prescribed . Attends all scheduled provider appointments . Calls pharmacy for medication refills . Calls provider office for new concerns or questions  Please see past updates related to this goal by clicking on the "Past Updates" button in the selected goal      .  "to have this pain checked out" (pt-stated)        [place] (see longitudinal plan of care for additional care plan information)  Current Barriers:  [name] Knowledge Deficits related to evaluation and treatment of acute pain to abdomen and lower back . Chronic Disease Management support and education needs related to Essential Hypertension, Prediabetes  Nurse Case Manager Clinical Goal(s):  [name] Over the next 7 days, patient will work with PCP to address needs related to evaluate and treat acute pain to abdomen and lower back Goal Met  . New 12/30/19 Over the next 90 days, patient will work with the CCM team and PCP for disease education and support for improved Self Health management of arthritic pain and impaired physical mobility   CCM RN CM Interventions:  12/30/19 call completed with patient  . Inter-disciplinary care team collaboration (see longitudinal plan of care) . Determined patient completed his OV with PCP [name], FNP to have his abdominal and lumbar pain evaluated . Reviewed and discussed PCP recommendations for treatment of musculoskeletal pain and xray  indicated constipation and xray indicated atherosclerosis  . Determined patient will start an OTC stool softener daily, he will add prune juice to his daily regimen as needed, he will proceed with starting in home PT for treatment of arthritic pain and Scoliosis  . Educated patient on PCP recommendation to increase Atorvastatin to 40 mg q hs for increased atherosclerosis determined by xray, pt verbalizes understanding   . Collaborated with PCP regarding patient's request to start in home PT; requested Atorvastatin be resent to [employer]  . Discussed plans with patient for ongoing care management follow up and provided patient with direct contact information for care management team 01/06/20 inbound call completed with patient and son Montine Circle . Determined patient's son has questions about patient's recent abdominal xray results . Reviewed and discussed the following xray results with patient and son Montine Circle following PCP review; o FINDINGS: Two supine frontal views of the abdomen and pelvis are obtained, excluding the hemidiaphragms by collimation. There is moderate retained stool throughout the colon. No obstruction or ileus. No masses or abnormal calcifications. Extensive vascular calcifications are seen within the distal aorta and iliac vessels. There is mild symmetrical bilateral hip osteoarthritis. No  acute bony abnormalities IMPRESSION: 1. Moderate fecal retention. 2. Extensive atherosclerosis. . Discussed and Reviewed PCP recommendations;  o "Take a stool softener daily and be sure to drink at leas 4 bottles of water a day. Also you have cholesterol build up so I will increase your atorvastatin to 40 mg at bedtime, you can see if the pharmacy has your new prescription. I have sent a new prescription to the pharmacy" o Educated patient/son on dietary and exercise recommendations to help manage constipation; Educated on how to change bowel habits by way of bowel training   o Mailed printed education material related to How to Manage Constipation  o Discussed plans with patient for ongoing care management follow up and provided patient with direct contact information for care management team  Patient Self Care Activities:  . Patient verbalizes understanding of plan to start new Atorvastatin dosage of 40 mg q hs; start OTC stool softener daily . Self administers medications as prescribed . Attends all scheduled provider appointments . Calls pharmacy for medication refills . Calls provider office for new concerns or questions  Please see past updates related to this goal by clicking on the "Past Updates" button in the selected goal         Plan:   Telephone follow up appointment with care management team member scheduled for: 01/29/20  Barb Merino, RN, BSN, CCM Care Management Coordinator [place] [facility]  Direct Phone: [phone]

## 2020-01-07 NOTE — Patient Instructions (Signed)
Visit Information  Goals Addressed      Patient Stated   .  "I would like for my legs to feel better" (pt-stated)        Joseph Olson (see longtitudinal plan of care for additional care plan information)  Current Barriers:  [name] Knowledge Deficits related to evaluation and treatment for chronic pain to lower extremities . Chronic Disease Management support and education needs related to Essential HTN, Prediabetes  Nurse Case Manager Clinical Goal(s):  . New 01/06/20 Over the next 90 days, patient will work with the CCM team and PCP to address needs related to disease education and support to improve Self Health management of chronic pain to lower extremities  CCM RN CM Interventions:  01/06/20 Inbound call completed with patient and son Joseph Olson . Determined patient's son has questions about the outcome of Mr. Mcginnity's recent xray to evaluate the increased pain to his lower extremities . Determined patient f/u with PCP provider [name], FNP on 12/19/19 to have this symptom evaluated . Reviewed and discussed the following xray results with son and patient following review by PCP:  o FINDINGS: o Frontal, bilateral oblique, lateral views of the lumbar spine o demonstrate 5 non-rib-bearing lumbar type vertebral bodies with mild o right convex scoliosis centered at L1/L2. Otherwise alignment is o anatomic. There is diffuse facet hypertrophy greatest at the o lumbosacral junction. Disc space height is relatively well o preserved. Sacroiliac joints are normal. There is extensive o atherosclerosis of the aorta and its distal branches. o IMPRESSION: o 1. Mild right convex scoliosis. o 2. Prominent lower lumbar facet hypertrophy. o 3. Extensive atherosclerosis. . Informed son PCP has recommended PT and patient is agreeable . Collaborated with PCP via in basket message to request in home PT referral be sent . Discussed plans with patient for ongoing care management follow up and provided  patient with direct contact information for care management team  Patient Self Care Activities:  . Self administers medications as prescribed . Attends all scheduled provider appointments . Calls pharmacy for medication refills . Calls provider office for new concerns or questions  Please see past updates related to this goal by clicking on the "Past Updates" button in the selected goal      .  "to have this pain checked out" (pt-stated)        [place] (see longitudinal plan of care for additional care plan information)  Current Barriers:  [name] Knowledge Deficits related to evaluation and treatment of acute pain to abdomen and lower back . Chronic Disease Management support and education needs related to Essential Hypertension, Prediabetes  Nurse Case Manager Clinical Goal(s):  [name] Over the next 7 days, patient will work with PCP to address needs related to evaluate and treat acute pain to abdomen and lower back Goal Met  . New 12/30/19 Over the next 90 days, patient will work with the CCM team and PCP for disease education and support for improved Self Health management of arthritic pain and impaired physical mobility   CCM RN CM Interventions:  12/30/19 call completed with patient  . Inter-disciplinary care team collaboration (see longitudinal plan of care) . Determined patient completed his OV with PCP [name], FNP to have his abdominal and lumbar pain evaluated . Reviewed and discussed PCP recommendations for treatment of musculoskeletal pain and xray indicated constipation and xray indicated atherosclerosis  . Determined patient will start an OTC stool softener daily, he will add prune juice to his  daily regimen as needed, he will proceed with starting in home PT for treatment of arthritic pain and Scoliosis  . Educated patient on PCP recommendation to increase Atorvastatin to 40 mg q hs for increased atherosclerosis determined by xray, pt verbalizes understanding    . Collaborated with PCP regarding patient's request to start in home PT; requested Atorvastatin be resent to [employer]  . Discussed plans with patient for ongoing care management follow up and provided patient with direct contact information for care management team 01/06/20 inbound call completed with patient and son Joseph Olson . Determined patient's son has questions about patient's recent abdominal xray results . Reviewed and discussed the following xray results with patient and son Joseph Olson following PCP review; o FINDINGS: Two supine frontal views of the abdomen and pelvis are obtained, excluding the hemidiaphragms by collimation. There is moderate retained stool throughout the colon. No obstruction or ileus. No masses or abnormal calcifications. Extensive vascular calcifications are seen within the distal aorta and iliac vessels. There is mild symmetrical bilateral hip osteoarthritis. No acute bony abnormalities IMPRESSION: 1. Moderate fecal retention. 2. Extensive atherosclerosis. . Discussed and Reviewed PCP recommendations;  o "Take a stool softener daily and be sure to drink at leas 4 bottles of water a day. Also you have cholesterol build up so I will increase your atorvastatin to 40 mg at bedtime, you can see if the pharmacy has your new prescription. I have sent a new prescription to the pharmacy" o Educated patient/son on dietary and exercise recommendations to help manage constipation; Educated on how to change bowel habits by way of bowel training  o Mailed printed education material related to How to Manage Constipation  o Discussed plans with patient for ongoing care management follow up and provided patient with direct contact information for care management team  Patient Self Care Activities:  . Patient verbalizes understanding of plan to start new Atorvastatin dosage of 40 mg q hs; start OTC stool softener daily . Self administers medications as  prescribed . Attends all scheduled provider appointments . Calls pharmacy for medication refills . Calls provider office for new concerns or questions  Please see past updates related to this goal by clicking on the "Past Updates" button in the selected goal        The patient verbalized understanding of instructions, educational materials, and care plan provided today and declined offer to receive copy of patient instructions, educational materials, and care plan.   Telephone follow up appointment with care management team member scheduled for: 01/29/20  Barb Merino, RN, BSN, CCM Care Management Coordinator [facility]  Direct Phone: [phone]

## 2020-01-09 ENCOUNTER — Other Ambulatory Visit: Payer: Self-pay

## 2020-01-09 ENCOUNTER — Telehealth: Payer: Self-pay

## 2020-01-09 NOTE — Telephone Encounter (Signed)
The pt's granddaughter came in and had a concern that her the pt has no appetite, he's withering away, shaking and can't pass his stools, that the stool softeners aren't working and that he disagrees with Laurance Flatten, DFNP-BC's treatment plan, that he doesn't want to see the NP anymore that if he can't see Dr Baird Cancer he doesn't want to come here anymore, The family was notified that DR Baird Cancer said that the pt can get a referral to a Geriatric Specialist.  The family agrees to the referral and was told that the information would be sent to the office manager.

## 2020-01-09 NOTE — Telephone Encounter (Signed)
referral

## 2020-01-13 ENCOUNTER — Emergency Department (HOSPITAL_COMMUNITY): Payer: Medicare HMO

## 2020-01-13 ENCOUNTER — Other Ambulatory Visit: Payer: Self-pay

## 2020-01-13 ENCOUNTER — Inpatient Hospital Stay (HOSPITAL_COMMUNITY)
Admission: EM | Admit: 2020-01-13 | Discharge: 2020-01-13 | DRG: 951 | Disposition: A | Discharge status: Hospice | Payer: Medicare HMO | Attending: Internal Medicine | Admitting: Internal Medicine

## 2020-01-13 DIAGNOSIS — F17210 Nicotine dependence, cigarettes, uncomplicated: Secondary | ICD-10-CM | POA: Diagnosis present

## 2020-01-13 DIAGNOSIS — R64 Cachexia: Secondary | ICD-10-CM | POA: Diagnosis present

## 2020-01-13 DIAGNOSIS — Z515 Encounter for palliative care: Secondary | ICD-10-CM | POA: Diagnosis not present

## 2020-01-13 DIAGNOSIS — G8929 Other chronic pain: Secondary | ICD-10-CM | POA: Diagnosis present

## 2020-01-13 DIAGNOSIS — C3490 Malignant neoplasm of unspecified part of unspecified bronchus or lung: Secondary | ICD-10-CM | POA: Diagnosis not present

## 2020-01-13 DIAGNOSIS — E119 Type 2 diabetes mellitus without complications: Secondary | ICD-10-CM | POA: Diagnosis present

## 2020-01-13 DIAGNOSIS — R000 Tachycardia, unspecified: Secondary | ICD-10-CM | POA: Diagnosis not present

## 2020-01-13 DIAGNOSIS — R404 Transient alteration of awareness: Secondary | ICD-10-CM | POA: Diagnosis not present

## 2020-01-13 DIAGNOSIS — E785 Hyperlipidemia, unspecified: Secondary | ICD-10-CM | POA: Diagnosis present

## 2020-01-13 DIAGNOSIS — I2510 Atherosclerotic heart disease of native coronary artery without angina pectoris: Secondary | ICD-10-CM | POA: Diagnosis not present

## 2020-01-13 DIAGNOSIS — Z20822 Contact with and (suspected) exposure to covid-19: Secondary | ICD-10-CM | POA: Diagnosis not present

## 2020-01-13 DIAGNOSIS — Z79899 Other long term (current) drug therapy: Secondary | ICD-10-CM | POA: Diagnosis not present

## 2020-01-13 DIAGNOSIS — Z6824 Body mass index (BMI) 24.0-24.9, adult: Secondary | ICD-10-CM | POA: Diagnosis not present

## 2020-01-13 DIAGNOSIS — Z7189 Other specified counseling: Secondary | ICD-10-CM | POA: Diagnosis not present

## 2020-01-13 DIAGNOSIS — R531 Weakness: Secondary | ICD-10-CM

## 2020-01-13 DIAGNOSIS — R918 Other nonspecific abnormal finding of lung field: Secondary | ICD-10-CM | POA: Diagnosis not present

## 2020-01-13 DIAGNOSIS — E876 Hypokalemia: Secondary | ICD-10-CM | POA: Diagnosis not present

## 2020-01-13 DIAGNOSIS — R0689 Other abnormalities of breathing: Secondary | ICD-10-CM | POA: Diagnosis not present

## 2020-01-13 DIAGNOSIS — S22050A Wedge compression fracture of T5-T6 vertebra, initial encounter for closed fracture: Secondary | ICD-10-CM | POA: Diagnosis not present

## 2020-01-13 DIAGNOSIS — K5909 Other constipation: Secondary | ICD-10-CM | POA: Diagnosis present

## 2020-01-13 DIAGNOSIS — E43 Unspecified severe protein-calorie malnutrition: Secondary | ICD-10-CM | POA: Diagnosis present

## 2020-01-13 DIAGNOSIS — A419 Sepsis, unspecified organism: Secondary | ICD-10-CM | POA: Diagnosis not present

## 2020-01-13 DIAGNOSIS — C787 Secondary malignant neoplasm of liver and intrahepatic bile duct: Secondary | ICD-10-CM | POA: Diagnosis present

## 2020-01-13 DIAGNOSIS — J189 Pneumonia, unspecified organism: Secondary | ICD-10-CM | POA: Diagnosis not present

## 2020-01-13 DIAGNOSIS — R0902 Hypoxemia: Secondary | ICD-10-CM | POA: Diagnosis not present

## 2020-01-13 DIAGNOSIS — Z66 Do not resuscitate: Secondary | ICD-10-CM | POA: Diagnosis present

## 2020-01-13 DIAGNOSIS — Z7984 Long term (current) use of oral hypoglycemic drugs: Secondary | ICD-10-CM | POA: Diagnosis not present

## 2020-01-13 DIAGNOSIS — R911 Solitary pulmonary nodule: Secondary | ICD-10-CM | POA: Diagnosis not present

## 2020-01-13 DIAGNOSIS — J440 Chronic obstructive pulmonary disease with acute lower respiratory infection: Secondary | ICD-10-CM | POA: Diagnosis present

## 2020-01-13 DIAGNOSIS — I10 Essential (primary) hypertension: Secondary | ICD-10-CM | POA: Diagnosis present

## 2020-01-13 DIAGNOSIS — I313 Pericardial effusion (noninflammatory): Secondary | ICD-10-CM | POA: Diagnosis not present

## 2020-01-13 DIAGNOSIS — J910 Malignant pleural effusion: Secondary | ICD-10-CM | POA: Diagnosis not present

## 2020-01-13 DIAGNOSIS — I959 Hypotension, unspecified: Secondary | ICD-10-CM | POA: Diagnosis present

## 2020-01-13 LAB — COMPREHENSIVE METABOLIC PANEL
ALT: 17 U/L (ref 0–44)
AST: 40 U/L (ref 15–41)
Albumin: 2.1 g/dL — ABNORMAL LOW (ref 3.5–5.0)
Alkaline Phosphatase: 75 U/L (ref 38–126)
Anion gap: 13 (ref 5–15)
BUN: 26 mg/dL — ABNORMAL HIGH (ref 8–23)
CO2: 27 mmol/L (ref 22–32)
Calcium: 9.3 mg/dL (ref 8.9–10.3)
Chloride: 94 mmol/L — ABNORMAL LOW (ref 98–111)
Creatinine, Ser: 0.91 mg/dL (ref 0.61–1.24)
GFR, Estimated: >60 mL/min (ref >60)
Glucose, Bld: 195 mg/dL — ABNORMAL HIGH (ref 70–99)
Potassium: 2.8 mmol/L — ABNORMAL LOW (ref 3.5–5.1)
Sodium: 134 mmol/L — ABNORMAL LOW (ref 135–145)
Total Bilirubin: 0.6 mg/dL (ref 0.3–1.2)
Total Protein: 7.2 g/dL (ref 6.5–8.1)

## 2020-01-13 LAB — CBC WITH DIFFERENTIAL/PLATELET
Abs Immature Granulocytes: 0.04 10*3/uL (ref 0.00–0.07)
Basophils Absolute: 0 10*3/uL (ref 0.0–0.1)
Basophils Relative: 0 %
Eosinophils Absolute: 0.1 10*3/uL (ref 0.0–0.5)
Eosinophils Relative: 1 %
HCT: 30.4 % — ABNORMAL LOW (ref 39.0–52.0)
Hemoglobin: 9.9 g/dL — ABNORMAL LOW (ref 13.0–17.0)
Immature Granulocytes: 1 %
Lymphocytes Relative: 16 %
Lymphs Abs: 1.2 10*3/uL (ref 0.7–4.0)
MCH: 29.5 pg (ref 26.0–34.0)
MCHC: 32.6 g/dL (ref 30.0–36.0)
MCV: 90.5 fL (ref 80.0–100.0)
Monocytes Absolute: 0.9 10*3/uL (ref 0.1–1.0)
Monocytes Relative: 12 %
Neutro Abs: 5.3 10*3/uL (ref 1.7–7.7)
Neutrophils Relative %: 70 %
Platelets: 449 10*3/uL — ABNORMAL HIGH (ref 150–400)
RBC: 3.36 MIL/uL — ABNORMAL LOW (ref 4.22–5.81)
RDW: 14.1 % (ref 11.5–15.5)
WBC: 7.6 10*3/uL (ref 4.0–10.5)
nRBC: 0 % (ref 0.0–0.2)

## 2020-01-13 LAB — RESPIRATORY PANEL BY RT PCR (FLU A&B, COVID)
Influenza A by PCR: NEGATIVE
Influenza B by PCR: NEGATIVE
SARS Coronavirus 2 by RT PCR: NEGATIVE

## 2020-01-13 LAB — LACTIC ACID, PLASMA
Lactic Acid, Venous: 4 mmol/L (ref 0.5–1.9)
Lactic Acid, Venous: 3 mmol/L (ref 0.5–1.9)

## 2020-01-13 MED ORDER — LACTATED RINGERS IV SOLN: INTRAVENOUS | Status: DC

## 2020-01-13 MED ORDER — LACTATED RINGERS IV BOLUS (SEPSIS): 1000.0000 mL | Freq: Once | INTRAVENOUS | Status: DC

## 2020-01-13 MED ORDER — LACTATED RINGERS IV BOLUS (SEPSIS): 250.0000 mL | Freq: Once | INTRAVENOUS | Status: DC

## 2020-01-13 MED ORDER — SODIUM CHLORIDE 0.9 % IV SOLN
500.0000 mg | Freq: Once | INTRAVENOUS | Status: DC
  Filled 2020-01-13: qty 500

## 2020-01-13 MED ORDER — LORAZEPAM 1 MG PO TABS: 1.0000 mg | ORAL_TABLET | Freq: Three times a day (TID) | ORAL | 0 refills | Status: AC

## 2020-01-13 MED ORDER — IOHEXOL 300 MG/ML  SOLN
75.0000 mL | Freq: Once | INTRAMUSCULAR | Status: AC | PRN
  Administered 2020-01-13: 75 mL via INTRAVENOUS

## 2020-01-13 MED ORDER — SODIUM CHLORIDE 0.9 % IV SOLN: INTRAVENOUS | Status: DC

## 2020-01-13 MED ORDER — MORPHINE SULFATE 10 MG/5ML PO SOLN: 5.0000 mg | ORAL | Status: DC | PRN

## 2020-01-13 MED ORDER — MORPHINE SULFATE 10 MG/5ML PO SOLN: 5.0000 mg | ORAL | 0 refills | Status: AC | PRN

## 2020-01-13 MED ORDER — SODIUM CHLORIDE 0.9 % IV BOLUS
1000.0000 mL | Freq: Once | INTRAVENOUS | Status: AC
  Administered 2020-01-13: 1000 mL via INTRAVENOUS

## 2020-01-13 MED ORDER — POTASSIUM CHLORIDE CRYS ER 20 MEQ PO TBCR
60.0000 meq | EXTENDED_RELEASE_TABLET | Freq: Once | ORAL | Status: AC
  Administered 2020-01-13: 60 meq via ORAL
  Filled 2020-01-13: qty 3

## 2020-01-13 MED ORDER — LORAZEPAM 0.5 MG PO TABS: 0.5000 mg | ORAL_TABLET | Freq: Four times a day (QID) | ORAL | Status: DC | PRN

## 2020-01-13 MED ORDER — SODIUM CHLORIDE 0.9 % IV SOLN
1.0000 g | Freq: Once | INTRAVENOUS | Status: DC
  Filled 2020-01-13: qty 10

## 2020-01-13 NOTE — ED Notes (Signed)
MD at bedside. 

## 2020-01-13 NOTE — Progress Notes (Signed)
Elink following code sepsis °

## 2020-01-13 NOTE — Consult Note (Signed)
Consultation Note Date: 01/13/2020   Patient Name: Joseph Olson  DOB: October 30, 1927  MRN: [id number]  Age / Sex: 86 y.o., male  PCP: [name], [place] Referring Physician: Lequita Halt, MD  Reason for Consultation: Establishing goals of care  HPI/Patient Profile: 86 y.o. male  with past medical history of hypertension, hyperlipidemia, and insulin-dependent diabetes, and chronic back pain. He presented to the emergency department on 01/13/2020 with back pain, chest pain, blood loss, and increasing shortness of breath for 2 weeks. Also with significantly decreased appetite.  ED Course: Chest x-ray showed bilateral multiple nodules. CT chest showed multiple pulmonary nodules and masses of various sizes bilaterally with a dominant right upper lobe mass which is likely invading the pleura, malignant right pleural effusion, and a left hepatic mass.   Clinical Assessment and Goals of Care: I have reviewed medical records including EPIC notes, labs and imaging,  and met at bedside with patient and granddaughter Lisabeth Pick)  to discuss diagnosis, prognosis, GOC, EOL wishes, disposition, and options. Patient's son Montine Circle) was present via speaker phone.  I introduced Palliative Medicine as specialized medical care for people living with serious illness. It focuses on providing relief from the symptoms and stress of a serious illness.   We discussed a brief life review of the patient. He was born in [place], but spent much of his life in [place]. He worked for the [employer]. He has 5 biological children and 4 adopted children. He re-located to  about 18 years ago. His wife passed away 2 years ago.   As far as functional and nutritional status, there has been a decline in the past 2 weeks. Previously, he was ambulatory and independent with ADLs. Patient lives with his son Montine Circle) and his grandson.   We  discussed his current illness and what it means in the larger context of his ongoing co-morbidities.  Natural disease trajectory of metastatic was discussed.  I attempted to elicit values and goals of care important to the patient. His goal is to be at home with his family for the time he has left.  He understands his prognosis is only a few months.   The difference between aggressive medical intervention and comfort care was considered in light of the patient's goals of care. Patient and family clearly do not want life-prolonging interventions of any kind.  I introduced the concept of a comfort path to patient and family with the focus on comfort and dignity rather than cure/prolonging life.  Introduced hospice philosophy and provided information on home vs residential hospice services. Patient's wife passed under hospice care at home and the family had a very positive experience with their services. Family requests [facility] in case there is a need to transition to residential hospice (they [facility]).   Questions and concerns were addressed.  The family was encouraged to call with questions or concerns.   Primary decision maker: Patient, with support from family.     SUMMARY OF RECOMMENDATIONS   - DNR/DNI  as previously documented - patient/family does not want any life-prolonging measures - goal is for patient to remain at home as long as possible - patient/family agrees with hospice care at home - requests [facility]  - I have made the referral and spoke directly with after hours RN Nira Conn)  - patient will be admitted to hospice tomorrow and will need a hospital bed delivered - patient wants to go home as soon as possible, I have communicated this to the admitting MD  Code Status/Advance Care Planning:  DNR  Symptom Management:  - ativan 0.5 mg every 6 hours as needed for anxiety - morphine 10 mg/62ml solution 5 mg every 2 hours as needed for pain  Palliative  Prophylaxis:   Frequent Pain Assessment  Additional Recommendations (Limitations, Scope, Preferences):  Avoid Hospitalization  Psycho-social/Spiritual:   Created space and opportunity for patient and family to express thoughts and feelings regarding patient's current medical situation.   Emotional support provided  Prognosis:   < 6 months  Discharge Planning: Home with Hospice      Primary Diagnoses: Present on Admission: . Hypotension   I have reviewed the medical record, interviewed the patient and family, and examined the patient. The following aspects are pertinent.  Past Medical History:  Diagnosis Date  . Diabetes mellitus without complication ([place])      No family history on file. Scheduled Meds: Continuous Infusions: . azithromycin    . cefTRIAXone (ROCEPHIN)  IV    . lactated ringers     And  . lactated ringers     PRN Meds:.LORazepam, morphine Medications Prior to Admission:  Prior to Admission medications   Medication Sig Start Date End Date Taking? Authorizing Provider  aspirin 325 MG tablet Take 325 mg by mouth daily.   Yes [provider]  atorvastatin (LIPITOR) 10 MG tablet TAKE 1 TABLET (10 MG TOTAL) BY MOUTH DAILY AT 6 PM. Patient taking differently: Take 10 mg by mouth every evening. 6pm 01/06/20  Yes [name], FNP  diclofenac Sodium (VOLTAREN) 1 % GEL Apply 2 g topically 4 (four) times daily. 12/17/19  Yes [name], FNP  gabapentin (NEURONTIN) 100 MG capsule Take 1 capsule (100 mg total) by mouth at bedtime. 07/24/19  Yes [name], FNP  hydrochlorothiazide (HYDRODIURIL) 12.5 MG tablet Take 1 tablet (12.5 mg total) by mouth daily. Patient taking differently: Take 12.5 mg by mouth at bedtime.  04/22/19  Yes [name], FNP  metFORMIN (GLUCOPHAGE-XR) 500 MG 24 hr tablet TAKE ONE TABLET BY MOUTH DAILY Patient taking differently: Take 500 mg by mouth daily with breakfast.  01/06/20  Yes [name], FNP  Multiple  Vitamins-Minerals (CENTRUM SILVER 50+MEN PO) Take 1 tablet by mouth daily.   Yes [provider]  Omega-3 Fatty Acids (FISH OIL) 1200 MG CAPS Take 1 capsule by mouth daily.    Yes [provider]  atorvastatin (LIPITOR) 40 MG tablet Take 1 tablet (40 mg total) by mouth daily. Patient not taking: Reported on 01/13/2020 12/23/19   [name], FNP  CHANTIX CONTINUING MONTH PAK 1 MG tablet TAKE 1 TABLET TWICE DAILY Patient not taking: Reported on 01/13/2020 09/30/19   [name], FNP  diclofenac Sodium (VOLTAREN) 1 % GEL APPLY 2 GRAMS TOPICALLY 4 TIMES DAILY. Patient not taking: Reported on 01/13/2020 09/11/19   [name], FNP  Magnesium 200 MG TABS Take 1 tablet (200 mg total) by mouth daily. Take daily with your evening meal Patient not taking: Reported on 01/13/2020 06/04/19  [name], FNP   No Known Allergies Review of Systems  Constitutional: Positive for appetite change.    Physical Exam Vitals reviewed.  Constitutional:      General: He is not in acute distress. HENT:     Head: Normocephalic and atraumatic.  Cardiovascular:     Rate and Rhythm: Normal rate and regular rhythm.  Pulmonary:     Effort: Pulmonary effort is normal.  Neurological:     Mental Status: He is alert and oriented to person, place, and time.     Vital Signs: BP 124/73   Pulse 94   Temp 100.3 F (37.9 C) (Rectal)   Resp (!) 31   Ht $R'5\' 8"'qN$  (1.727 m)   Wt 72.1 kg   SpO2 97%   BMI 24.17 kg/m  Pain Scale: 0-10   Pain Score: 0-No pain   SpO2: SpO2: 97 % O2 Device:SpO2: 97 % O2 Flow Rate: .   IO: Intake/output summary: No intake or output data in the 24 hours ending 01/13/20 1801  LBM:   Baseline Weight: Weight: 72.1 kg Most recent weight: Weight: 72.1 kg      Palliative Assessment/Data: PPS 40%    Time In: 17:20 Time Out: 18:32 Time Total: 72 minutes Greater than 50%  of this time was spent counseling and coordinating care related to the above assessment and  plan.  Signed by: Lavena Bullion, NP   Please contact Palliative Medicine Team phone at [phone] for questions and concerns.  For individual provider: See Shea Evans

## 2020-01-13 NOTE — ED Triage Notes (Signed)
Patient BIB EMS from home where granddaughter called EMS because patient was having stomach and back pain. Granddaughter states that patient has been becoming progressively weaker over the past 2 weeks and is not eating.

## 2020-01-13 NOTE — Progress Notes (Signed)
Geneticist, molecular received referral for pt to [facility] home with hospice services. Spoke with pt's son, Dereck, who confirmed plan. Stated that he is unsure if pt will dc home tonight or not. Writer sent all pt information to [facility] referral dept who will follow up with pt's family upon discharge to arrange hospice admission.   Please do not hesitate to outreach with any questions and thank you for the referral.   Freddie Breech, RN  [facility] Triage Nurse [phone]

## 2020-01-13 NOTE — ED Provider Notes (Signed)
Care of the patient assumed at the change of shift pending Hospitalist consult. Spoke with Dr.Zhang, Hospitalist, who will evaluate the patient.    Truddie Hidden, MD 01/13/20 [phone]

## 2020-01-13 NOTE — ED Provider Notes (Signed)
[place] EMERGENCY DEPARTMENT Provider Note   CSN: [id number] Arrival date & time: 01/13/20  1014     History Chief Complaint  Patient presents with  . Weakness    Joseph Olson is a 86 y.o. male.  86 year old male presents with 1 month history of weakness and decreased appetite.  Has been using Ensure but has not had any solid food.  Abdominal pain 2 weeks ago and was diagnosed with constipation.  Been taking stool softeners.  Denies any abdominal discomfort at this time.  No fever or chills.  No cough shortness of breath.  Denies any headaches.  No focal weakness.  Denies any urinary symptoms.  Discussed with patient's granddaughter, who states that patient has had decreased activity.  Presents for further evaluation        Past Medical History:  Diagnosis Date  . Diabetes mellitus without complication [facility])     Patient Active Problem List   Diagnosis Date Noted  . Nocturia 10/23/2019  . Prediabetes 04/30/2018  . Essential hypertension 04/30/2018  . Tingling of skin 04/30/2018  . Sepsis ([place]) 04/21/2017    No past surgical history on file.     No family history on file.  Social History   Tobacco Use  . Smoking status: Current Every Day Smoker    Packs/day: 0.50    Years: 35.00    Pack years: 17.50  . Smokeless tobacco: Never Used  . Tobacco comment: he  Vaping Use  . Vaping Use: Never used  Substance Use Topics  . Alcohol use: Yes    Alcohol/week: 2.0 standard drinks    Types: 2 Cans of beer per week  . Drug use: No    Home Medications Prior to Admission medications   Medication Sig Start Date End Date Taking? Authorizing Provider  atorvastatin (LIPITOR) 10 MG tablet TAKE 1 TABLET (10 MG TOTAL) BY MOUTH DAILY AT 6 PM. 01/06/20   [name], FNP  atorvastatin (LIPITOR) 40 MG tablet Take 1 tablet (40 mg total) by mouth daily. 12/23/19   [name], FNP  CHANTIX CONTINUING MONTH PAK 1 MG tablet TAKE 1 TABLET TWICE DAILY 09/30/19    [name], FNP  diclofenac Sodium (VOLTAREN) 1 % GEL APPLY 2 GRAMS TOPICALLY 4 TIMES DAILY. 09/11/19   [name], FNP  diclofenac Sodium (VOLTAREN) 1 % GEL Apply 2 g topically 4 (four) times daily. 12/17/19   [name], FNP  gabapentin (NEURONTIN) 100 MG capsule Take 1 capsule (100 mg total) by mouth at bedtime. 07/24/19   [name], FNP  hydrochlorothiazide (HYDRODIURIL) 12.5 MG tablet Take 1 tablet (12.5 mg total) by mouth daily. 04/22/19   [name], FNP  Magnesium 200 MG TABS Take 1 tablet (200 mg total) by mouth daily. Take daily with your evening meal 06/04/19   [name], FNP  metFORMIN (GLUCOPHAGE-XR) 500 MG 24 hr tablet TAKE ONE TABLET BY MOUTH DAILY 01/06/20   [name], FNP  Omega-3 Fatty Acids (FISH OIL PO) Take 1 tablet by mouth daily.    [provider]  OVER THE COUNTER MEDICATION Arthritis cream    [provider]    Allergies    Patient has no known allergies.  Review of Systems   Review of Systems  All other systems reviewed and are negative.   Physical Exam Updated Vital Signs BP (!) 120/51 (BP Location: Right Arm)   Pulse (!) 110   Temp 98.3 F (36.8 C) (Oral)   Resp 20  Ht 1.727 m (5\' 8" )   Wt 72.1 kg   SpO2 96%   BMI 24.17 kg/m   Physical Exam Vitals and nursing note reviewed.  Constitutional:      General: He is not in acute distress.    Appearance: Normal appearance. He is well-developed. He is not toxic-appearing.  HENT:     Head: Normocephalic and atraumatic.  Eyes:     General: Lids are normal.     Conjunctiva/sclera: Conjunctivae normal.     Pupils: Pupils are equal, round, and reactive to light.  Neck:     Thyroid: No thyroid mass.     Trachea: No tracheal deviation.  Cardiovascular:     Rate and Rhythm: Normal rate and regular rhythm.     Heart sounds: Normal heart sounds. No murmur heard.  No gallop.   Pulmonary:     Effort: Pulmonary effort is normal. No respiratory distress.     Breath  sounds: Normal breath sounds. No stridor. No decreased breath sounds, wheezing, rhonchi or rales.  Abdominal:     General: Bowel sounds are normal. There is no distension.     Palpations: Abdomen is soft.     Tenderness: There is no abdominal tenderness. There is no rebound.  Musculoskeletal:        General: No tenderness. Normal range of motion.     Cervical back: Normal range of motion and neck supple.  Skin:    General: Skin is warm and dry.     Findings: No abrasion or rash.  Neurological:     General: No focal deficit present.     Mental Status: He is alert and oriented to person, place, and time.     GCS: GCS eye subscore is 4. GCS verbal subscore is 5. GCS motor subscore is 6.     Cranial Nerves: No cranial nerve deficit.     Sensory: No sensory deficit.     Motor: Motor function is intact.     Coordination: Finger-Nose-Finger Test normal.  Psychiatric:        Attention and Perception: Attention normal.        Mood and Affect: Mood normal.        Speech: Speech normal.        Behavior: Behavior normal.     ED Results / Procedures / Treatments   Labs (all labs ordered are listed, but only abnormal results are displayed) Labs Reviewed - No data to display  EKG EKG Interpretation  Date/Time:  Monday January 13 2020 10:24:15 EST Ventricular Rate:  110 PR Interval:    QRS Duration: 83 QT Interval:  309 QTC Calculation: 418 R Axis:   89 Text Interpretation: Sinus tachycardia Borderline right axis deviation Borderline repolarization abnormality Confirmed by Lacretia Leigh ([id number]) on 01/13/2020 10:26:41 AM   Radiology No results found.  Procedures Procedures (including critical care time)  Medications Ordered in ED Medications  sodium chloride 0.9 % bolus 1,000 mL (has no administration in time range)  0.9 %  sodium chloride infusion (has no administration in time range)    ED Course  I have reviewed the triage vital signs and the nursing notes.  Pertinent  labs & imaging results that were available during my care of the patient were reviewed by me and considered in my medical decision making (see chart for details).    MDM Rules/Calculators/A&P  Patient had a chest x-ray which is concerning for malignancy.  Subsequent CT of the chest consistent with that.  Possible underlying pneumonia.  I take make elevated at 4.  Started on sepsis protocol with IV fluids as well as IV antibiotics.  Sepsis not considered at first and patient presented there for there is a delay with IV fluids.  Patient requests to be a DNR at this time.  This was confirmed with his son.  He has capacity to make that decision.  Will admit to the hospitalist service  CRITICAL CARE Performed by: Leota Jacobsen Total critical care time: 50 minutes Critical care time was exclusive of separately billable procedures and treating other patients. Critical care was necessary to treat or prevent imminent or life-threatening deterioration. Critical care was time spent personally by me on the following activities: development of treatment plan with patient and/or surrogate as well as nursing, discussions with consultants, evaluation of patient's response to treatment, examination of patient, obtaining history from patient or surrogate, ordering and performing treatments and interventions, ordering and review of laboratory studies, ordering and review of radiographic studies, pulse oximetry and re-evaluation of patient's condition.  Final Clinical Impression(s) / ED Diagnoses Final diagnoses:  None    Rx / DC Orders ED Discharge Orders    None       Lacretia Leigh, MD 01/13/20 1611

## 2020-01-13 NOTE — ED Notes (Signed)
Pt is wanting to leave. Roosevelt Locks, MD gave okay for pt to be discharged.

## 2020-01-13 NOTE — H&P (Signed)
History and Physical    Leanthony Rhett MGQ:[id number] DOB: April 01, 1927 DOA: 01/13/2020  PCP: [name], FNP (Confirm with patient/family/NH records and if not entered, this has to be entered at [facility] point of entry) Patient coming from: Home  I have personally briefly reviewed patient's old medical records in [place]  Chief Complaint: Back pain chest pain shortness of breath and weight loss  HPI: Deshane Cotroneo is a 86 y.o. male with medical history significant of HTN, IIDM, HLD presented with back pain, chest pain, blood loss and increasing shortness of breath.  Patient has had chronic back pain, for which he uses topical pain meds and as needed Tylenol.  2 weeks ago patient started to feel back pain has become poorly controlled and increasing he also feels right sided chest pain sharp-like, worsening with cough and deep breath.  He also had a subjective fever no chills.  No cough.  And his appetite has decreased significantly.  He refused to come to the hospital until today granddaughter insisted his come in for evaluation.  Patient also reported chronic constipation which has become worse over 1 month or so. ED Course: Chest x-ray showed bilateral multiple nodules and CT chest showed multiple nodules different sizing bilaterally with moderate right-sided pleural effusion, and a hepatic mass, likely metastatic lung cancer  Review of Systems: As per HPI otherwise 14 point review of systems negative.    Past Medical History:  Diagnosis Date   Diabetes mellitus without complication ([place])     No past surgical history on file.   reports that he has been smoking. He has a 17.50 pack-year smoking history. He has never used smokeless tobacco. He reports current alcohol use of about 2.0 standard drinks of alcohol per week. He reports that he does not use drugs.  No Known Allergies  No family history on file.   Prior to Admission medications   Medication Sig Start Date End Date  Taking? Authorizing Provider  aspirin 325 MG tablet Take 325 mg by mouth daily.   Yes [provider]  atorvastatin (LIPITOR) 10 MG tablet TAKE 1 TABLET (10 MG TOTAL) BY MOUTH DAILY AT 6 PM. Patient taking differently: Take 10 mg by mouth every evening. 6pm 01/06/20  Yes [name], FNP  diclofenac Sodium (VOLTAREN) 1 % GEL Apply 2 g topically 4 (four) times daily. 12/17/19  Yes [name], FNP  gabapentin (NEURONTIN) 100 MG capsule Take 1 capsule (100 mg total) by mouth at bedtime. 07/24/19  Yes [name], FNP  hydrochlorothiazide (HYDRODIURIL) 12.5 MG tablet Take 1 tablet (12.5 mg total) by mouth daily. Patient taking differently: Take 12.5 mg by mouth at bedtime.  04/22/19  Yes [name], FNP  metFORMIN (GLUCOPHAGE-XR) 500 MG 24 hr tablet TAKE ONE TABLET BY MOUTH DAILY Patient taking differently: Take 500 mg by mouth daily with breakfast.  01/06/20  Yes [name], FNP  Multiple Vitamins-Minerals (CENTRUM SILVER 50+MEN PO) Take 1 tablet by mouth daily.   Yes [provider]  Omega-3 Fatty Acids (FISH OIL) 1200 MG CAPS Take 1 capsule by mouth daily.    Yes [provider]  atorvastatin (LIPITOR) 40 MG tablet Take 1 tablet (40 mg total) by mouth daily. Patient not taking: Reported on 01/13/2020 12/23/19   [name], FNP  CHANTIX CONTINUING MONTH PAK 1 MG tablet TAKE 1 TABLET TWICE DAILY Patient not taking: Reported on 01/13/2020 09/30/19   [name], FNP  diclofenac Sodium (VOLTAREN) 1 % GEL APPLY 2 GRAMS TOPICALLY  4 TIMES DAILY. Patient not taking: Reported on 01/13/2020 09/11/19   [name], FNP  Magnesium 200 MG TABS Take 1 tablet (200 mg total) by mouth daily. Take daily with your evening meal Patient not taking: Reported on 01/13/2020 06/04/19   [name], FNP    Physical Exam: Vitals:   01/13/20 1400 01/13/20 1500 01/13/20 1515 01/13/20 1530  BP: (!) 116/53 (!) 155/137 (!) 117/56 (!) 105/49  Pulse: 91 93 93 99  Resp: (!) 29 (!) 21  (!) 28 (!) 29  Temp:      TempSrc:      SpO2: (!) 84% 100% 92% 95%  Weight:      Height:        Constitutional: NAD, calm, comfortable Vitals:   01/13/20 1400 01/13/20 1500 01/13/20 1515 01/13/20 1530  BP: (!) 116/53 (!) 155/137 (!) 117/56 (!) 105/49  Pulse: 91 93 93 99  Resp: (!) 29 (!) 21 (!) 28 (!) 29  Temp:      TempSrc:      SpO2: (!) 84% 100% 92% 95%  Weight:      Height:       Eyes: PERRL, lids and conjunctivae normal ENMT: Mucous membranes are moist. Posterior pharynx clear of any exudate or lesions.Normal dentition.  Neck: normal, supple, no masses, no thyromegaly Respiratory: Decreased breathing sound on the right side no wheezing, no crackles.  Increasing respiratory effort. No accessory muscle use.  Cardiovascular: Regular rate and rhythm, no murmurs / rubs / gallops. No extremity edema. 2+ pedal pulses. No carotid bruits.  Abdomen: no tenderness, no masses palpated. No hepatosplenomegaly. Bowel sounds positive.  Musculoskeletal: no clubbing / cyanosis. No joint deformity upper and lower extremities. Good ROM, no contractures. Normal muscle tone.  Skin: no rashes, lesions, ulcers. No induration Neurologic: CN 2-12 grossly intact. Sensation intact, DTR normal. Strength 5/5 in all 4.  Psychiatric: Normal judgment and insight. Alert and oriented x 3. Normal mood.     Labs on Admission: I have personally reviewed following labs and imaging studies  CBC: Recent Labs  Lab 01/13/20 1035  WBC 7.6  NEUTROABS 5.3  HGB 9.9*  HCT 30.4*  MCV 90.5  PLT 324*   Basic Metabolic Panel: Recent Labs  Lab 01/13/20 1035  NA 134*  K 2.8*  CL 94*  CO2 27  GLUCOSE 195*  BUN 26*  CREATININE 0.91  CALCIUM 9.3   GFR: Estimated Creatinine Clearance: 52.2 mL/min (by C-G formula based on SCr of 0.91 mg/dL). Liver Function Tests: Recent Labs  Lab 01/13/20 1035  AST 40  ALT 17  ALKPHOS 75  BILITOT 0.6  PROT 7.2  ALBUMIN 2.1*   No results for input(s): LIPASE,  AMYLASE in the last 168 hours. No results for input(s): AMMONIA in the last 168 hours. Coagulation Profile: No results for input(s): INR, PROTIME in the last 168 hours. Cardiac Enzymes: No results for input(s): CKTOTAL, CKMB, CKMBINDEX, TROPONINI in the last 168 hours. BNP (last 3 results) No results for input(s): PROBNP in the last 8760 hours. HbA1C: No results for input(s): HGBA1C in the last 72 hours. CBG: No results for input(s): GLUCAP in the last 168 hours. Lipid Profile: No results for input(s): CHOL, HDL, LDLCALC, TRIG, CHOLHDL, LDLDIRECT in the last 72 hours. Thyroid Function Tests: No results for input(s): TSH, T4TOTAL, FREET4, T3FREE, THYROIDAB in the last 72 hours. Anemia Panel: No results for input(s): VITAMINB12, FOLATE, FERRITIN, TIBC, IRON, RETICCTPCT in the last 72 hours. Urine analysis:    Component  Value Date/Time   COLORURINE YELLOW 04/21/2017 1330   APPEARANCEUR CLEAR 04/21/2017 1330   LABSPEC 1.018 04/21/2017 1330   PHURINE 5.0 04/21/2017 1330   GLUCOSEU NEGATIVE 04/21/2017 1330   HGBUR MODERATE (A) 04/21/2017 1330   BILIRUBINUR NEGATIVE 10/18/2018 1452   KETONESUR 5 (A) 04/21/2017 1330   PROTEINUR Negative 10/18/2018 1452   PROTEINUR 30 (A) 04/21/2017 1330   UROBILINOGEN 0.2 10/18/2018 1452   NITRITE NEGATIVE 10/18/2018 1452   NITRITE NEGATIVE 04/21/2017 1330   LEUKOCYTESUR Negative 10/18/2018 1452    Radiological Exams on Admission: CT Chest W Contrast  Result Date: 01/13/2020 CLINICAL DATA:  Stomach and back pain. Progressive weakness over the last 2 weeks. Pulmonary nodule on chest radiography. EXAM: CT CHEST WITH CONTRAST TECHNIQUE: Multidetector CT imaging of the chest was performed during intravenous contrast administration. CONTRAST:  54mL OMNIPAQUE IOHEXOL 300 MG/ML  SOLN COMPARISON:  Chest radiograph 01/13/2020 FINDINGS: Cardiovascular: Coronary, aortic arch, and branch vessel atherosclerotic vascular disease. Small pericardial effusion.  Mediastinum/Nodes: Prominent upper mediastinal, paratracheal, subcarinal, prevascular, and right hilar adenopathy compatible with malignancy. Index right lower paratracheal node 2.2 cm in short axis, image 62 of series 3. Right hilar node 2.7 cm in short axis, image 74 series 3. Lungs/Pleura: Innumerable pulmonary nodules and pulmonary masses of various sizes scattered in both lungs. The largest of these is a 10.1 by 9.5 by 11.6 cm right upper lobe mass which is likely invading the pleura. There is a moderate right pleural effusion with enhancing tumor nodules posteriorly including a bilobed 3.6 by 1.5 cm tumor nodule along the right parietal pleura on image 123 of series 3. Upper Abdomen: There findings of hepatic metastatic disease on the included images, including a 3.7 by 3.0 cm mass in the lateral segment left hepatic lobe on image 152 of series 3. Musculoskeletal: T6 inferior endplate compression fracture, probably benign. IMPRESSION: 1. Innumerable pulmonary nodules and pulmonary masses of various sizes scattered in both lungs, with a dominant 10.1 by 9.5 by 11.6 cm right upper lobe mass which is likely invading the pleura. Malignant right pleural effusion with enhancing tumor nodules posteriorly including a bilobed 3.6 by 1.5 cm tumor nodule along the right parietal pleura. Extensive mediastinal and right hilar adenopathy. Left hepatic lobe mass favoring metastatic lesion. 2. Coronary, aortic arch, and branch vessel atherosclerotic vascular disease. 3. Small pericardial effusion. 4. T6 inferior endplate compression fracture, probably benign. 5. Aortic atherosclerosis. Aortic Atherosclerosis (ICD10-I70.0). Electronically Signed   By: Van Clines M.D.   On: 01/13/2020 15:54   DG Chest Port 1 View  Result Date: 01/13/2020 CLINICAL DATA:  Back pain and weakness. EXAM: PORTABLE CHEST 1 VIEW COMPARISON:  Single-view of the chest 04/21/2017. FINDINGS: There is and large opacity in the right mid and  upper lobe lung zones consistent with a mass and possible postobstructive pneumonitis. At least 2 mass lesions are present in the right lung base. Multiple nodules are seen in the left lung and measure up to 2.6 cm in diameter. IMPRESSION: Bilateral pulmonary nodules and masses consistent with carcinoma. Dense opacity in the right upper lobe may be due in part 2 postobstructive pneumonitis. Electronically Signed   By: Inge Rise M.D.   On: 01/13/2020 13:56    EKG: Independently reviewed.  Sinus tachycardia  Assessment/Plan Active Problems:   Hypotension  (please populate well all problems here in Problem List. (For example, if patient is on BP meds at home and you resume or decide to hold them, it is a problem that  needs to be her. Same for CAD, COPD, HLD and so on)  Metastatic lung CA, metastatic liver CA -Long discussion with patient and son over the conference call, regarding the finding on the CAT scan and prognosis extremely poor given patient's age and nutrition status and extensive dense of the cancer in the lungs.  Primary site unknown but probably colon given his new onset of GI symptoms, both patient and son agreed with hospice.  Discussed with hospice team, likely home hospice. -Admit to MedSurg to bridging for home hospice, start as needed morphine and Ativan. -Discontinue all other medications.  Hypokalemia -As above  Probably obstructive pneumonia -As above  Cachexia/severe protein calorie malnutrition - Hospice, remove calorie restrictions  HTN, IIDM, HLD -D/C home meds  DVT prophylaxis: SCD Code Status: DNR Family Communication: Son over conference call Disposition Plan: Admit to [facility] and bridging for home hospice. Consults called: Hospice Admission status: MedSurg   Lequita Halt MD [facility] Pager [phone]  01/13/2020, 5:12 PM

## 2020-01-14 ENCOUNTER — Other Ambulatory Visit: Payer: Self-pay

## 2020-01-14 DIAGNOSIS — R918 Other nonspecific abnormal finding of lung field: Secondary | ICD-10-CM

## 2020-01-18 LAB — CULTURE, BLOOD (ROUTINE X 2)
Culture: NO GROWTH
Culture: NO GROWTH
Special Requests: ADEQUATE

## 2020-01-28 ENCOUNTER — Ambulatory Visit: Payer: Medicare HMO | Admitting: Nurse Practitioner

## 2020-01-29 ENCOUNTER — Telehealth: Payer: Medicare HMO

## 2020-01-29 ENCOUNTER — Ambulatory Visit: Payer: Self-pay

## 2020-01-29 ENCOUNTER — Other Ambulatory Visit: Payer: Self-pay

## 2020-01-29 DIAGNOSIS — R7303 Prediabetes: Secondary | ICD-10-CM

## 2020-01-29 DIAGNOSIS — R918 Other nonspecific abnormal finding of lung field: Secondary | ICD-10-CM

## 2020-01-29 DIAGNOSIS — I10 Essential (primary) hypertension: Secondary | ICD-10-CM

## 2020-01-30 NOTE — Patient Instructions (Signed)
Visit Information  Goals Addressed      Patient Stated   .  COMPLETED: "I would like for my legs to feel better" (pt-stated)        CARE PLAN ENTRY (see longtitudinal plan of care for additional care plan information)  Current Barriers:  [name] Knowledge Deficits related to evaluation and treatment for chronic pain to lower extremities . Chronic Disease Management support and education needs related to Essential HTN, Prediabetes  Nurse Case Manager Clinical Goal(s):  . New 01/06/20 Over the next 90 days, patient will work with the CCM team and PCP to address needs related to disease education and support to improve Self Health management of chronic pain to lower extremities  CCM RN CM Interventions:  01/29/20 call completed with son Joseph Olson . Determined patient had a recent ED visit with c/o weakness . Determined patient was found to have a lung mass suspicious for malignancy . Determined patient was discharged home with Hospice Care in place for end of life care . Confirmed with son Joseph Olson, Hospice has been in place for approximately 3 weeks and the family was notified today, patient is at End of Life with approximately 1 week to live . Determined son is aware of all services offered by Hospice including clergyman and grief counseling for the family . Determined son feels satisfied with the services Joseph Olson is receiving at this time and is appreciative of the call . Discussed patient will be closed from CCM at this time  . Message sent to PCP with an update concerning patient's admission to Hospice and CCM case closure  Patient Self Care Activities:  . Self administers medications as prescribed . Attends all scheduled provider appointments . Calls pharmacy for medication refills . Calls provider office for new concerns or questions  Please see past updates related to this goal by clicking on the "Past Updates" button in the selected goal      .  COMPLETED: "to have this pain checked  out" (pt-stated)        CARE PLAN ENTRY (see longitudinal plan of care for additional care plan information)  Current Barriers:  [name] Knowledge Deficits related to evaluation and treatment of acute pain to abdomen and lower back . Chronic Disease Management support and education needs related to Essential Hypertension, Prediabetes  Nurse Case Manager Clinical Goal(s):  [name] Over the next 7 days, patient will work with PCP to address needs related to evaluate and treat acute pain to abdomen and lower back Goal Met  . New 12/30/19 Over the next 90 days, patient will work with the CCM team and PCP for disease education and support for improved Self Health management of arthritic pain and impaired physical mobility   CCM RN CM Interventions:  01/29/20 call completed with son Joseph Olson . Determined patient had a recent ED visit with c/o weakness . Determined patient was found to have a lung mass suspicious for malignancy . Determined patient was discharged home with Hospice Care in place for end of life care . Confirmed with son Joseph Olson, Hospice has been in place for approximately 3 weeks and the family was notified today, patient is at End of Life with approximately 1 week to live . Determined son is aware of all services offered by Hospice including clergyman and grief counseling for the family . Determined son feels satisfied with the services Joseph Olson is receiving at this time and is appreciative of the call . Discussed patient will be closed from CCM  at this time  . Message sent to PCP with an update concerning patient's admission to Hospice and CCM case closure  Patient Self Care Activities:  . Patient verbalizes understanding of plan to start new Atorvastatin dosage of 40 mg q hs; start OTC stool softener daily . Self administers medications as prescribed . Attends all scheduled provider appointments . Calls pharmacy for medication refills . Calls provider office for new concerns or  questions  Please see past updates related to this goal by clicking on the "Past Updates" button in the selected goal      .  COMPLETED: "to keep blood sugar good" (pt-stated)        CARE PLAN ENTRY (see longtitudinal plan of care for additional care plan information)  Current Barriers:  [name] Knowledge Deficits related to disease process and Self Health management of DM . Chronic Disease Management support and education needs related to Prediabetes, Essential Hypertension   Nurse Case Manager Clinical Goal(s):  [name] Over the next 90 days, patient will work with CCM RN CM and PCP to address needs related to disease education and support for improved Self Health management of Prediabetes  CCM RN CM Interventions:  01/29/20 call completed with son Joseph Olson . Determined patient had a recent ED visit with c/o weakness . Determined patient was found to have a lung mass suspicious for malignancy . Determined patient was discharged home with Hospice Care in place for end of life care . Confirmed with son Joseph Olson, Hospice has been in place for approximately 3 weeks and the family was notified today, patient is at End of Life with approximately 1 week to live . Determined son is aware of all services offered by Hospice including clergyman and grief counseling for the family . Determined son feels satisfied with the services Joseph Olson is receiving at this time and is appreciative of the call . Discussed patient will be closed from CCM at this time  . Message sent to PCP with an update concerning patient's admission to Hospice and CCM case closure  Patient Self Care Activities:  . Self administers medications as prescribed . Attends all scheduled provider appointments . Calls pharmacy for medication refills . Performs ADL's independently . Performs IADL's independently . Calls provider office for new concerns or questions  Initial goal documentation     .  COMPLETED: "to keep my BP under good control"  (pt-stated)        CARE PLAN ENTRY (see longtitudinal plan of care for additional care plan information)  Current Barriers:  [name] Knowledge Deficits related to disease process and Self Health management of HTN . Chronic Disease Management support and education needs related to Essential Hypertension and Prediabetes  Nurse Case Manager Clinical Goal(s):  [name] Over the next 90 days, patient will work with CCM RN CM and PCP to address needs related to disease education and support to improve Self Health management of HTN  CCM RN CM Interventions:  01/29/20 call completed with son Joseph Olson . Determined patient had a recent ED visit with c/o weakness . Determined patient was found to have a lung mass suspicious for malignancy . Determined patient was discharged home with Hospice Care in place for end of life care . Confirmed with son Joseph Olson, Hospice has been in place for approximately 3 weeks and the family was notified today, patient is at End of Life with approximately 1 week to live . Determined son is aware of all services offered by Hospice including clergyman and  grief counseling for the family . Determined son feels satisfied with the services Joseph Olson is receiving at this time and is appreciative of the call . Discussed patient will be closed from CCM at this time  . Message sent to PCP with an update concerning patient's admission to Hospice and CCM case closure  Patient Self Care Activities:  . Self administers medications as prescribed . Attends all scheduled provider appointments . Calls pharmacy for medication refills . Performs ADL's independently . Performs IADL's independently . Calls provider office for new concerns or questions  Initial goal documentation       The patient verbalized understanding of instructions, educational materials, and care plan provided today and declined offer to receive copy of patient instructions, educational materials, and care plan.   CCM  enrollment status changed to "previously enrolled" due to patient is receiving Hospice for End of Life care. Case closed to case management services in primary care home.   Lynne Logan, RN

## 2020-01-30 NOTE — Chronic Care Management (AMB) (Signed)
Chronic Care Management   Follow Up Note   01/29/2020 Name: Joseph Olson MRN: [id number] DOB: [birth year]  Referred by: [name], FNP Reason for referral : Chronic Care Management (RNCM FU Call/Case Closure )   Joseph Olson is a 86 y.o. year old male who is a primary care patient of [name], [place]. The CCM team was consulted for assistance with chronic disease management and care coordination needs.    Review of patient status, including review of consultants reports, relevant laboratory and other test results, and collaboration with appropriate care team members and the patient's provider was performed as part of comprehensive patient evaluation and provision of chronic care management services.    SDOH (Social Determinants of Health) assessments performed: No See Care Plan activities for detailed interventions related to [name])   Placed outbound CCM RN CM follow up call to son Vicente Males for a status update concerning [place].     Outpatient Encounter Medications as of 01/29/2020  Medication Sig  . diclofenac Sodium (VOLTAREN) 1 % GEL Apply 2 g topically 4 (four) times daily.  [name] gabapentin (NEURONTIN) 100 MG capsule Take 1 capsule (100 mg total) by mouth at bedtime.  [name] LORazepam (ATIVAN) 1 MG tablet Take 1 tablet (1 mg total) by mouth every 8 (eight) hours.  [name] morphine 10 MG/5ML solution Take 2.5 mLs (5 mg total) by mouth every 2 (two) hours as needed for severe pain (anxiety).   No facility-administered encounter medications on file as of 01/29/2020.     Objective:  Lab Results  Component Value Date   HGBA1C 6.5 (H) 10/23/2019   HGBA1C 5.8 (H) 04/22/2019   HGBA1C 6.1 (H) 10/11/2018   Lab Results  Component Value Date   MICROALBUR 10 10/18/2018   LDLCALC 72 10/23/2019   CREATININE 0.91 01/13/2020   BP Readings from Last 3 Encounters:  01/13/20 133/65  12/17/19 132/68  10/23/19 138/66    Goals Addressed      Patient Stated   .  COMPLETED: "I would like for  my legs to feel better" (pt-stated)        CARE PLAN ENTRY (see longtitudinal plan of care for additional care plan information)  Current Barriers:  [name] Knowledge Deficits related to evaluation and treatment for chronic pain to lower extremities . Chronic Disease Management support and education needs related to Essential HTN, Prediabetes  Nurse Case Manager Clinical Goal(s):  . New 01/06/20 Over the next 90 days, patient will work with the CCM team and PCP to address needs related to disease education and support to improve Self Health management of chronic pain to lower extremities  CCM RN CM Interventions:  01/29/20 call completed with son Vicente Males . Determined patient had a recent ED visit with c/o weakness . Determined patient was found to have a lung mass suspicious for malignancy . Determined patient was discharged home with Hospice Care in place for end of life care . Confirmed with son Vicente Males, Hospice has been in place for approximately 3 weeks and the family was notified today, patient is at End of Life with approximately 1 week to live . Determined son is aware of all services offered by Hospice including clergyman and grief counseling for the family . Determined son feels satisfied with the services [name] is receiving at this time and is appreciative of the call . Discussed patient will be closed from CCM at this time  . Message sent to PCP with an update concerning patient's admission to Hospice and  CCM case closure  Patient Self Care Activities:  . Self administers medications as prescribed . Attends all scheduled provider appointments . Calls pharmacy for medication refills . Calls provider office for new concerns or questions  Please see past updates related to this goal by clicking on the "Past Updates" button in the selected goal      .  COMPLETED: "to have this pain checked out" (pt-stated)        CARE PLAN ENTRY (see longitudinal plan of care for additional care  plan information)  Current Barriers:  [name] Knowledge Deficits related to evaluation and treatment of acute pain to abdomen and lower back . Chronic Disease Management support and education needs related to Essential Hypertension, Prediabetes  Nurse Case Manager Clinical Goal(s):  [name] Over the next 7 days, patient will work with PCP to address needs related to evaluate and treat acute pain to abdomen and lower back Goal Met  . New 12/30/19 Over the next 90 days, patient will work with the CCM team and PCP for disease education and support for improved Self Health management of arthritic pain and impaired physical mobility   CCM RN CM Interventions:  01/29/20 call completed with son Francee Piccolo . Determined patient had a recent ED visit with c/o weakness . Determined patient was found to have a lung mass suspicious for malignancy . Determined patient was discharged home with Hospice Care in place for end of life care . Confirmed with son Francee Piccolo, Hospice has been in place for approximately 3 weeks and the family was notified today, patient is at End of Life with approximately 1 week to live . Determined son is aware of all services offered by Hospice including clergyman and grief counseling for the family . Determined son feels satisfied with the services [name] is receiving at this time and is appreciative of the call . Discussed patient will be closed from CCM at this time  . Message sent to PCP with an update concerning patient's admission to Hospice and CCM case closure  Patient Self Care Activities:  . Patient verbalizes understanding of plan to start new Atorvastatin dosage of 40 mg q hs; start OTC stool softener daily . Self administers medications as prescribed . Attends all scheduled provider appointments . Calls pharmacy for medication refills . Calls provider office for new concerns or questions  Please see past updates related to this goal by clicking on the "Past Updates" button in the  selected goal      .  COMPLETED: "to keep blood sugar good" (pt-stated)        CARE PLAN ENTRY (see longtitudinal plan of care for additional care plan information)  Current Barriers:  [name] Knowledge Deficits related to disease process and Self Health management of DM . Chronic Disease Management support and education needs related to Prediabetes, Essential Hypertension   Nurse Case Manager Clinical Goal(s):  [name] Over the next 90 days, patient will work with CCM RN CM and PCP to address needs related to disease education and support for improved Self Health management of Prediabetes  CCM RN CM Interventions:  01/29/20 call completed with son Francee Piccolo . Determined patient had a recent ED visit with c/o weakness . Determined patient was found to have a lung mass suspicious for malignancy . Determined patient was discharged home with Hospice Care in place for end of life care . Confirmed with son Francee Piccolo, Hospice has been in place for approximately 3 weeks and the family was notified today, patient  is at End of Life with approximately 1 week to live . Determined son is aware of all services offered by Hospice including clergyman and grief counseling for the family . Determined son feels satisfied with the services Mr. Niess is receiving at this time and is appreciative of the call . Discussed patient will be closed from CCM at this time  . Message sent to PCP with an update concerning patient's admission to Hospice and CCM case closure  Patient Self Care Activities:  . Self administers medications as prescribed . Attends all scheduled provider appointments . Calls pharmacy for medication refills . Performs ADL's independently . Performs IADL's independently . Calls provider office for new concerns or questions  Initial goal documentation     .  COMPLETED: "to keep my BP under good control" (pt-stated)        CARE PLAN ENTRY (see longtitudinal plan of care for additional care plan  information)  Current Barriers:  [name] Knowledge Deficits related to disease process and Self Health management of HTN . Chronic Disease Management support and education needs related to Essential Hypertension and Prediabetes  Nurse Case Manager Clinical Goal(s):  [name] Over the next 90 days, patient will work with CCM RN CM and PCP to address needs related to disease education and support to improve Self Health management of HTN  CCM RN CM Interventions:  01/29/20 call completed with son Vicente Males . Determined patient had a recent ED visit with c/o weakness . Determined patient was found to have a lung mass suspicious for malignancy . Determined patient was discharged home with Hospice Care in place for end of life care . Confirmed with son Vicente Males, Hospice has been in place for approximately 3 weeks and the family was notified today, patient is at End of Life with approximately 1 week to live . Determined son is aware of all services offered by Hospice including clergyman and grief counseling for the family . Determined son feels satisfied with the services Mr. Eskew is receiving at this time and is appreciative of the call . Discussed patient will be closed from CCM at this time  . Message sent to PCP with an update concerning patient's admission to Hospice and CCM case closure  Patient Self Care Activities:  . Self administers medications as prescribed . Attends all scheduled provider appointments . Calls pharmacy for medication refills . Performs ADL's independently . Performs IADL's independently . Calls provider office for new concerns or questions  Initial goal documentation       Plan:   CCM enrollment status changed to "previously enrolled" due to patient is receiving Hospice for End of Life Care. Case closed to case management services in primary care home.   Barb Merino, RN, BSN, CCM Care Management Coordinator [place] [facility]  Direct Phone:  [phone]

## 2020-02-22 DEATH — deceased

## 2020-03-22 NOTE — Discharge Summary (Signed)
Physician Discharge Summary  Joseph Olson QQV:[id number] DOB: 1927/02/28 DOA: 01/13/2020  PCP: [name], FNP  Admit date: 01/13/2020 Discharge date: 03/22/2020  Admitted From: Home Disposition: Home hospice  Recommendations for Outpatient Follow-up:  1. Follow up with PCP in 1-2 weeks 2. Please obtain BMP/CBC in one week 3. Please follow up on the following pending results:  Home Health: Home hospice Equipment/Devices: None  Discharge Condition: Hospice CODE STATUS: DNR Diet recommendation: Regular diet  Brief/Interim Summary:  87 year old male patient with past [place] HTN, diabetes, HLD, presented with worsening of back pain chest pain and increasing short of breath.  ED work-up showed bilateral multi pleural nodules and CT chest confirmed that multiple nodules of different sizing bilaterally with moderate right-sided pleural effusion and hepatic mass likely metastatic lung cancer.  Given the extensiveness of metastatic lung cancers as above liver lesion also likely metastatic cancer, prognosis grave, medical treatment including oncology work-up and treatment unlikely will change the course of the disease. Expect less than 3 months life expectancy.  Discussed with patient and family at bedside both agreed with changing of goal of care to comfort measure only.  Hospice was consulted in ER who recommend home hospice and patient discharged to home with home hospice same day.  Discharge Diagnoses:  Active Problems:   Hypotension   Hospice care patient  Metastatic lung cancer  Discharge Instructions  Discharge Instructions    Diet - low sodium heart healthy   Complete by: As directed    Increase activity slowly   Complete by: As directed      Allergies as of 01/13/2020   No Known Allergies     Medication List    STOP taking these medications   aspirin 325 MG tablet   atorvastatin 10 MG tablet Commonly known as: LIPITOR   atorvastatin 40 MG tablet Commonly  known as: LIPITOR   CENTRUM SILVER 50+MEN PO   Chantix Continuing Month Pak 1 MG tablet Generic drug: varenicline   Fish Oil 1200 MG Caps   hydrochlorothiazide 12.5 MG tablet Commonly known as: HYDRODIURIL   Magnesium 200 MG Tabs   metFORMIN 500 MG 24 hr tablet Commonly known as: GLUCOPHAGE-XR     TAKE these medications   diclofenac Sodium 1 % Gel Commonly known as: VOLTAREN Apply 2 g topically 4 (four) times daily. What changed: Another medication with the same name was removed. Continue taking this medication, and follow the directions you see here.   gabapentin 100 MG capsule Commonly known as: NEURONTIN Take 1 capsule (100 mg total) by mouth at bedtime.   LORazepam 1 MG tablet Commonly known as: Ativan Take 1 tablet (1 mg total) by mouth every 8 (eight) hours.   morphine 10 MG/5ML solution Take 2.5 mLs (5 mg total) by mouth every 2 (two) hours as needed for severe pain (anxiety).       No Known Allergies  Consultations:  Palliative care/hospice   Procedures/Studies:  No results found. (Echo, Carotid, EGD, Colonoscopy, ERCP)    Subjective:   Discharge Exam: Vitals:   01/13/20 1815 01/13/20 1830  BP: 137/62 133/65  Pulse:    Resp: (!) 31 (!) 25  Temp:    SpO2:     Vitals:   01/13/20 1745 01/13/20 1800 01/13/20 1815 01/13/20 1830  BP: (!) 110/59 125/60 137/62 133/65  Pulse: 96     Resp: (!) 29 (!) 22 (!) 31 (!) 25  Temp:      TempSrc:      SpO2: [name])  87%     Weight:      Height:        General: Pt is alert, awake, not in acute distress Cardiovascular: RRR, S1/S2 +, no rubs, no gallops Respiratory: CTA bilaterally, no wheezing, no rhonchi Abdominal: Soft, NT, ND, bowel sounds + Extremities: no edema, no cyanosis    The results of significant diagnostics from this hospitalization (including imaging, microbiology, ancillary and laboratory) are listed below for reference.     Microbiology: No results found for this or any previous  visit (from the past 240 hour(s)).   Labs: BNP (last 3 results) No results for input(s): BNP in the last 8760 hours. Basic Metabolic Panel: No results for input(s): NA, K, CL, CO2, GLUCOSE, BUN, CREATININE, CALCIUM, MG, PHOS in the last 168 hours. Liver Function Tests: No results for input(s): AST, ALT, ALKPHOS, BILITOT, PROT, ALBUMIN in the last 168 hours. No results for input(s): LIPASE, AMYLASE in the last 168 hours. No results for input(s): AMMONIA in the last 168 hours. CBC: No results for input(s): WBC, NEUTROABS, HGB, HCT, MCV, PLT in the last 168 hours. Cardiac Enzymes: No results for input(s): CKTOTAL, CKMB, CKMBINDEX, TROPONINI in the last 168 hours. BNP: Invalid input(s): POCBNP CBG: No results for input(s): GLUCAP in the last 168 hours. D-Dimer No results for input(s): DDIMER in the last 72 hours. Hgb A1c No results for input(s): HGBA1C in the last 72 hours. Lipid Profile No results for input(s): CHOL, HDL, LDLCALC, TRIG, CHOLHDL, LDLDIRECT in the last 72 hours. Thyroid function studies No results for input(s): TSH, T4TOTAL, T3FREE, THYROIDAB in the last 72 hours.  Invalid input(s): FREET3 Anemia work up No results for input(s): VITAMINB12, FOLATE, FERRITIN, TIBC, IRON, RETICCTPCT in the last 72 hours. Urinalysis    Component Value Date/Time   COLORURINE YELLOW 04/21/2017 1330   APPEARANCEUR CLEAR 04/21/2017 1330   LABSPEC 1.018 04/21/2017 1330   PHURINE 5.0 04/21/2017 1330   GLUCOSEU NEGATIVE 04/21/2017 1330   HGBUR MODERATE (A) 04/21/2017 1330   BILIRUBINUR NEGATIVE 10/18/2018 1452   KETONESUR 5 (A) 04/21/2017 1330   PROTEINUR Negative 10/18/2018 1452   PROTEINUR 30 (A) 04/21/2017 1330   UROBILINOGEN 0.2 10/18/2018 1452   NITRITE NEGATIVE 10/18/2018 1452   NITRITE NEGATIVE 04/21/2017 1330   LEUKOCYTESUR Negative 10/18/2018 1452   Sepsis Labs Invalid input(s): PROCALCITONIN,  WBC,  LACTICIDVEN Microbiology No results found for this or any previous  visit (from the past 240 hour(s)).   Time coordinating discharge: Over 30 minutes  SIGNED:   Lequita Halt, MD  [facility] 03/22/2020, 1:14 PM Pager  [phone]

## 2020-10-29 ENCOUNTER — Encounter: Payer: Medicare HMO | Admitting: Nurse Practitioner

## 2021-01-30 IMAGING — DX DG CHEST 1V PORT
1 series · 1 of 1 positions shown · non-contrast
Comparison: Single-view of the chest 04/21/2017.

CLINICAL DATA: Back pain and weakness.

EXAM:
PORTABLE CHEST 1 VIEW

[chest]
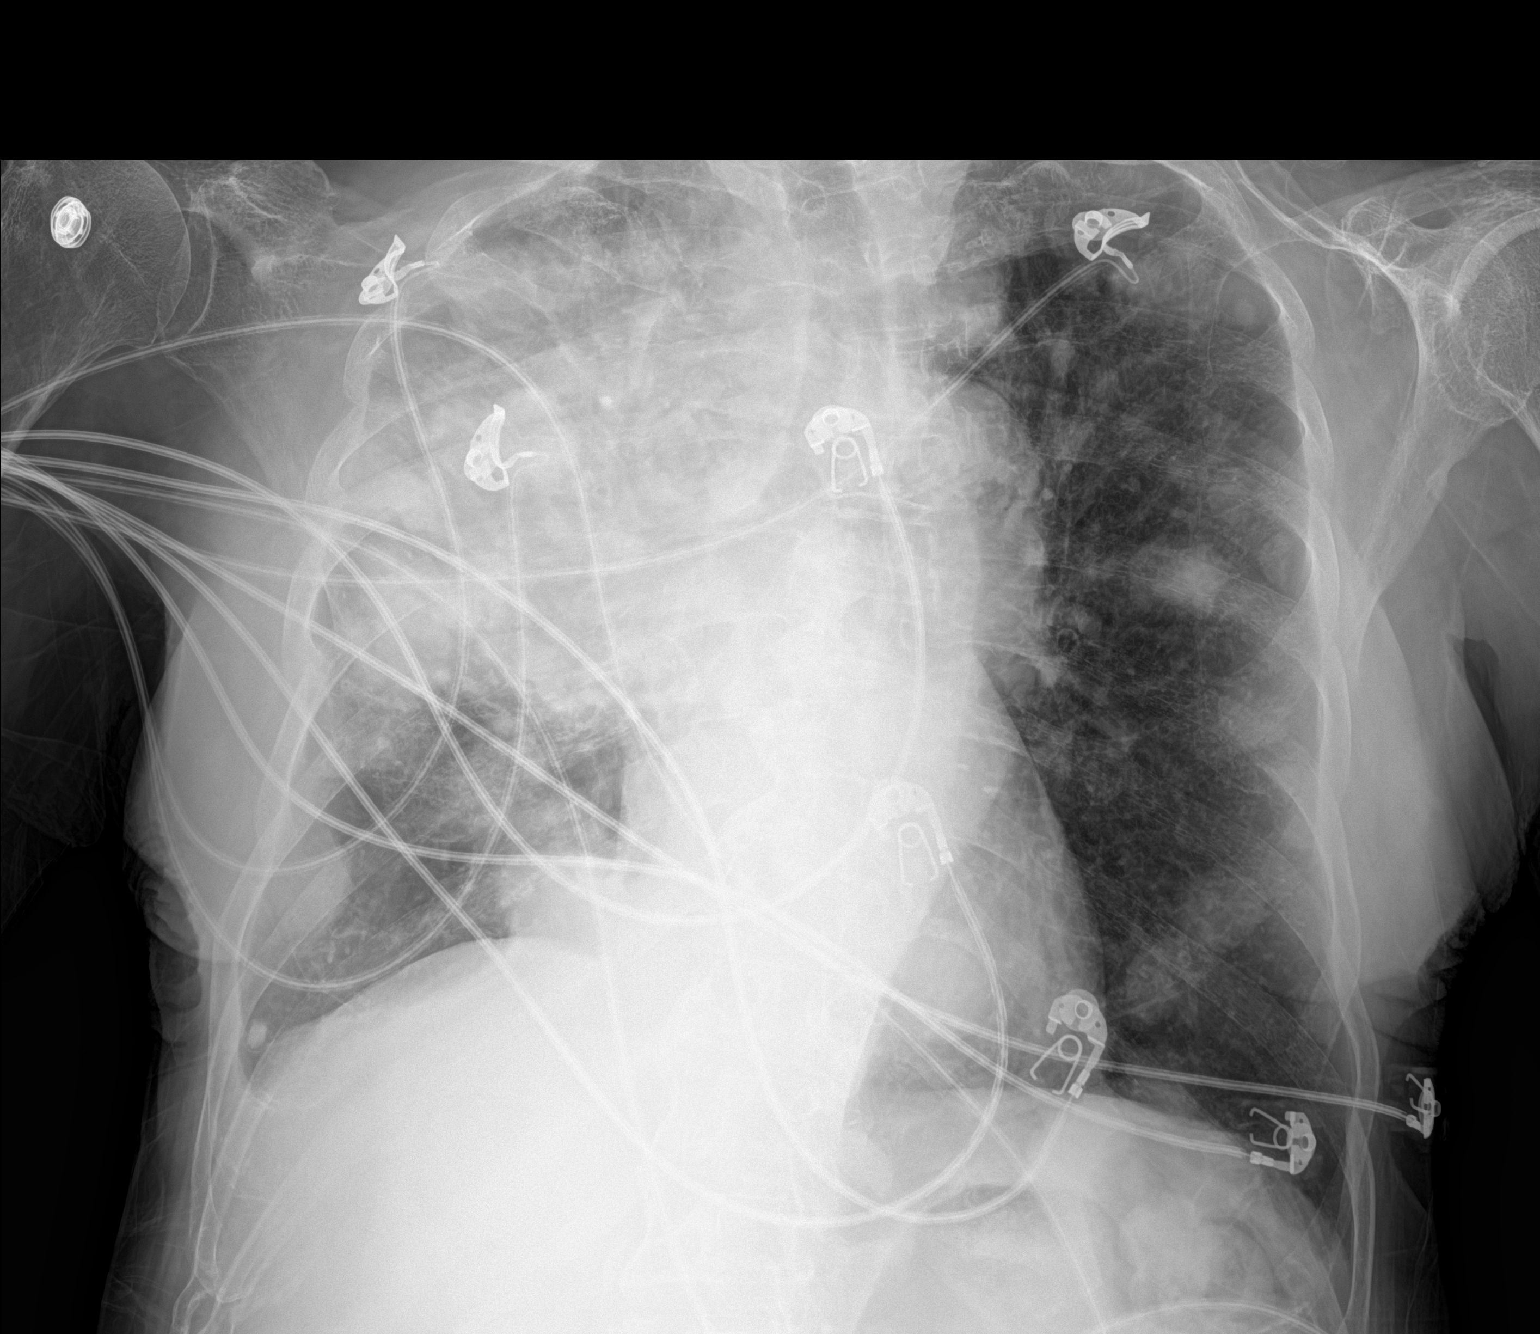

[1 of 1 positions shown; findings below may reference images not displayed]

FINDINGS: There is and large opacity in the right mid and upper lobe lung
zones consistent with a mass and possible postobstructive
pneumonitis. At least 2 mass lesions are present in the right lung
base. Multiple nodules are seen in the left lung and measure up to
2.6 cm in diameter.
IMPRESSION: Bilateral pulmonary nodules and masses consistent with carcinoma.
Dense opacity in the right upper lobe may be due in part 2
postobstructive pneumonitis.

## 2021-01-30 IMAGING — CT CT CHEST W/ CM
2 of 4 series · 15 of 36 positions shown, 18 images · IV contrast (APPLIED)
Comparison: Chest radiograph 01/13/2020

CLINICAL DATA: Stomach and back pain. Progressive weakness over the
last 2 weeks. Pulmonary nodule on chest radiography.

EXAM:
CT CHEST WITH CONTRAST
TECHNIQUE: Multidetector CT imaging of the chest was performed during
intravenous contrast administration.
CONTRAST:  75mL OMNIPAQUE IOHEXOL 300 MG/ML  SOLN

[Series 3: thorax 2.0 i31f 2 · axial · 0.80mm/px · z∈[+497,+775]mm · 12 of 163 slices shown, 15 images]
[im 12/163  mediastinal]
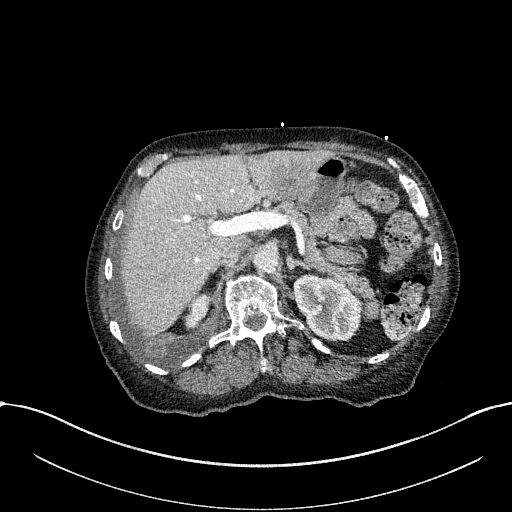
[im 12/163  lung]
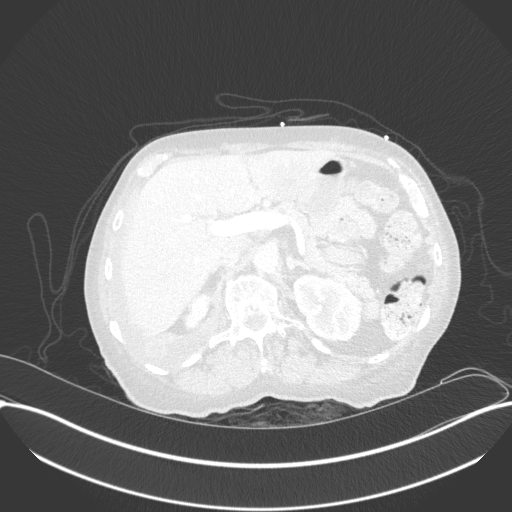
[im 24/163  lung]
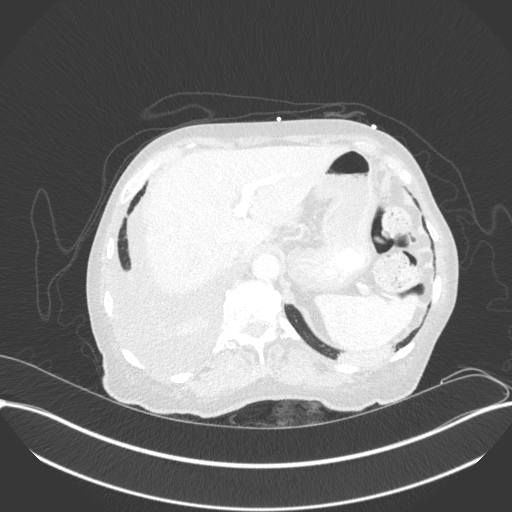
[im 35/163  lung]
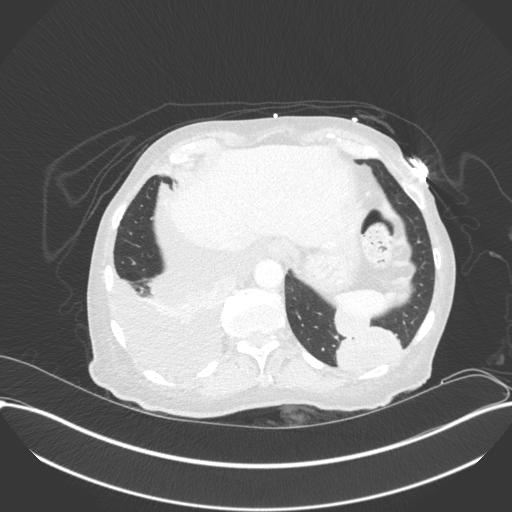
[im 47/163  lung]
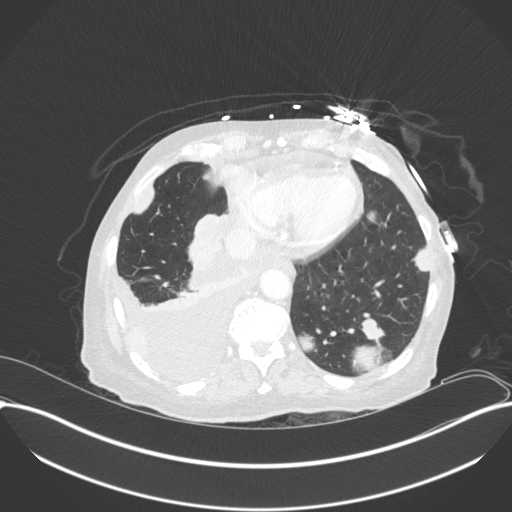
[im 58/163  mediastinal]
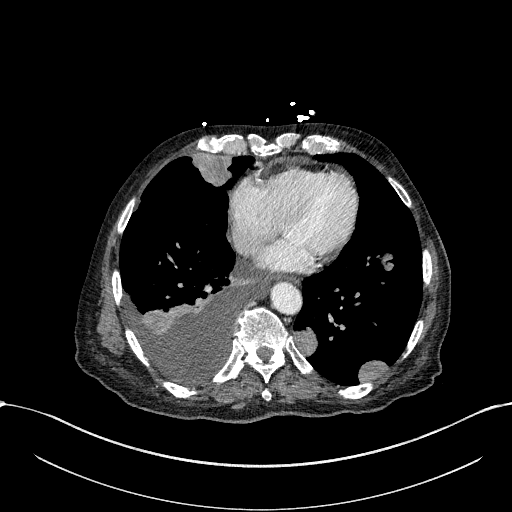
[im 58/163  lung]
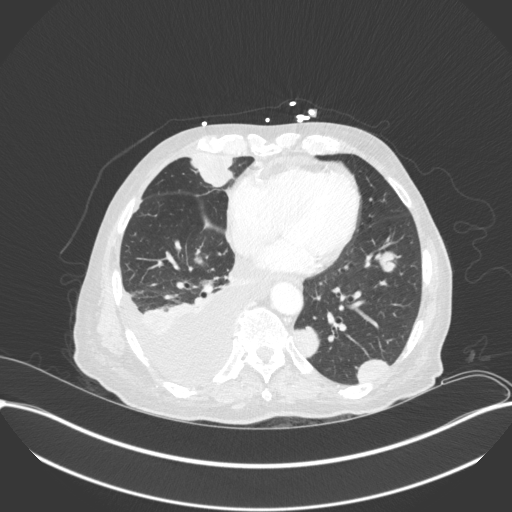
[im 70/163  lung]
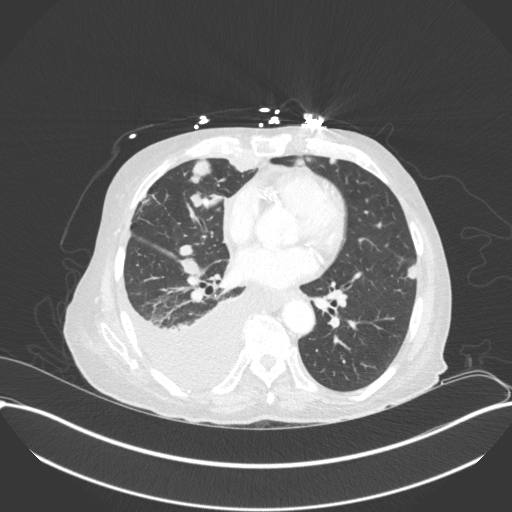
[im 93/163  lung]
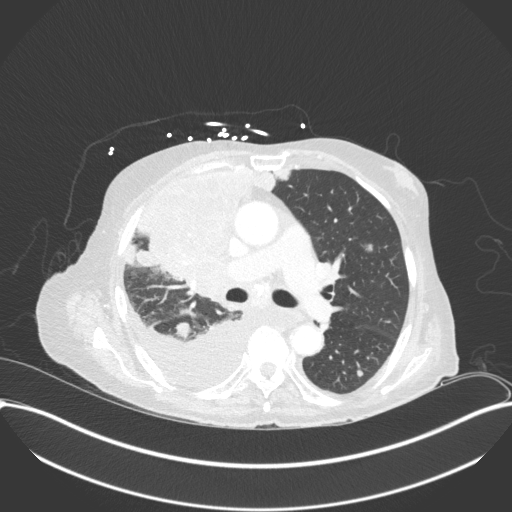
[im 105/163  lung]
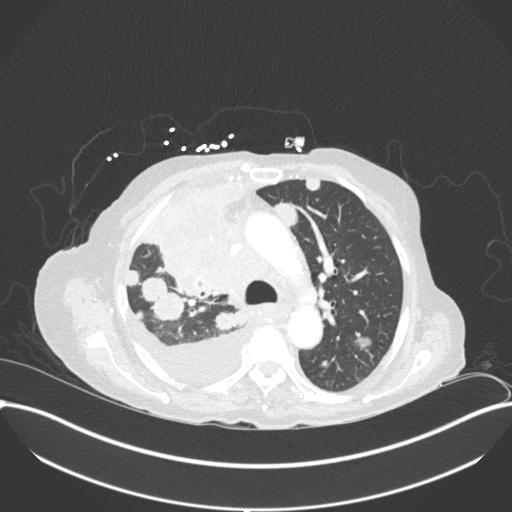
[im 116/163  mediastinal]
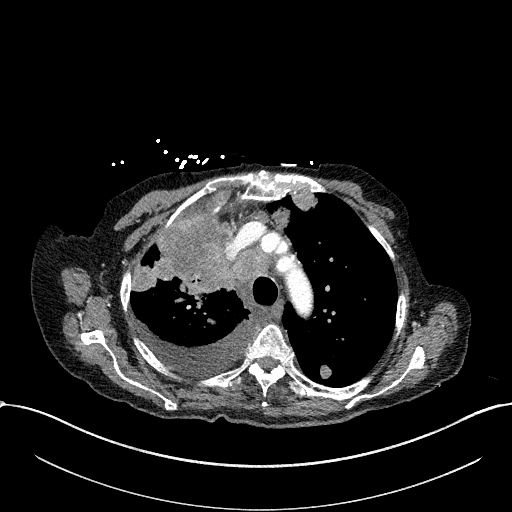
[im 116/163  lung]
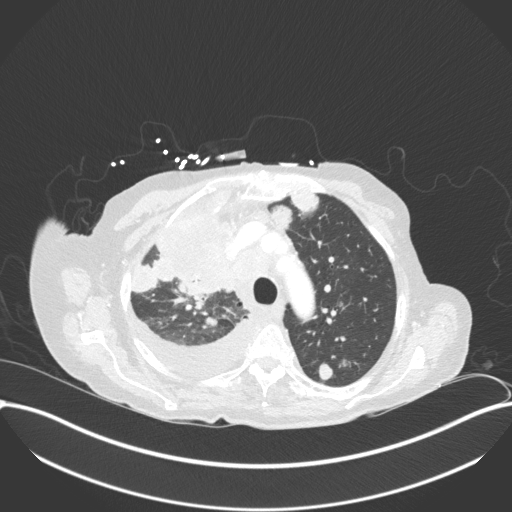
[im 128/163  lung]
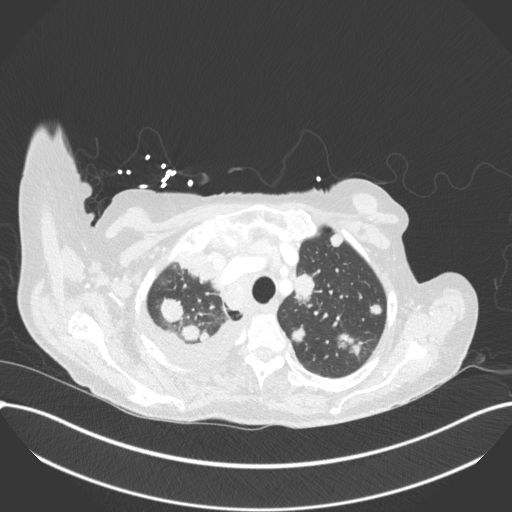
[im 139/163  lung]
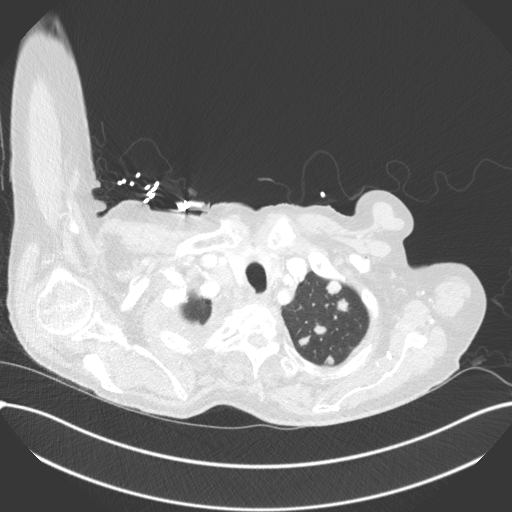
[im 151/163  lung]
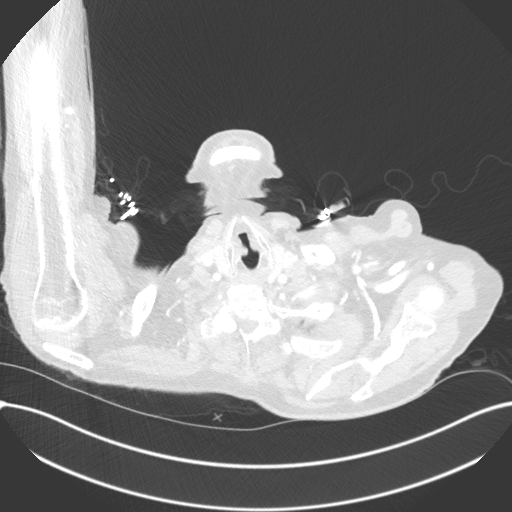

[Series 6: coronal · coronal · 0.70mm/px · 3 of 157 slices shown]
[im 32/157  lung]
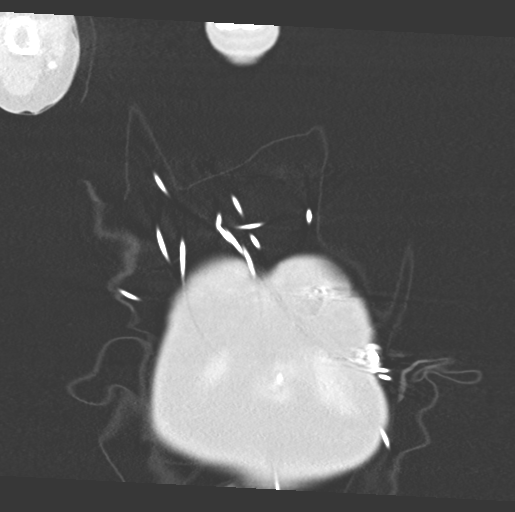
[im 63/157  lung]
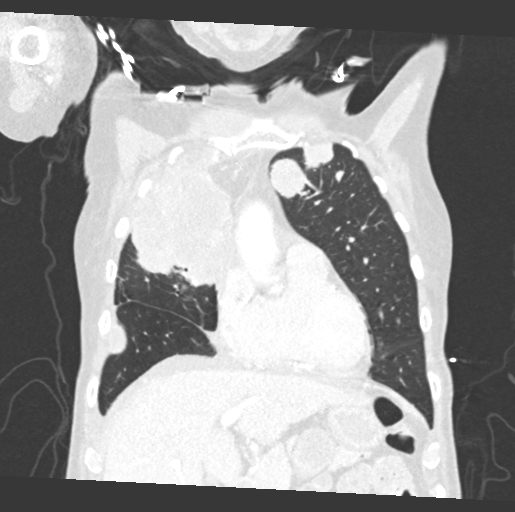
[im 94/157  lung]
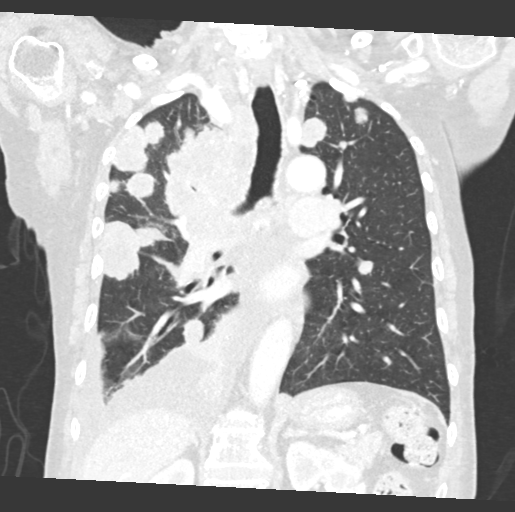

[15 of 36 positions shown; findings below may reference images not displayed]

FINDINGS: Cardiovascular: Coronary, aortic arch, and branch vessel
atherosclerotic vascular disease. Small pericardial effusion.

Mediastinum/Nodes: Prominent upper mediastinal, paratracheal,
subcarinal, prevascular, and right hilar adenopathy compatible with
malignancy. Index right lower paratracheal node 2.2 cm in short
axis, image 62 of series 3. Right hilar node 2.7 cm in short axis,
image 74 series 3.

Lungs/Pleura: Innumerable pulmonary nodules and pulmonary masses of
various sizes scattered in both lungs. The largest of these is a
10.1 by 9.5 by 11.6 cm right upper lobe mass which is likely
invading the pleura. There is a moderate right pleural effusion with
enhancing tumor nodules posteriorly including a bilobed 3.6 by
cm tumor nodule along the right parietal pleura on image 123 of
series 3.

Upper Abdomen: There findings of hepatic metastatic disease on the
included images, including a 3.7 by 3.0 cm mass in the lateral
segment left hepatic lobe on image 152 of series 3.

Musculoskeletal: T6 inferior endplate compression fracture, probably
benign.
IMPRESSION: 1. Innumerable pulmonary nodules and pulmonary masses of various
sizes scattered in both lungs, with a dominant 10.1 by 9.5 by
cm right upper lobe mass which is likely invading the pleura.
Malignant right pleural effusion with enhancing tumor nodules
posteriorly including a bilobed 3.6 by 1.5 cm tumor nodule along the
right parietal pleura. Extensive mediastinal and right hilar
adenopathy. Left hepatic lobe mass favoring metastatic lesion.
2. Coronary, aortic arch, and branch vessel atherosclerotic vascular
disease.
3. Small pericardial effusion.
4. T6 inferior endplate compression fracture, probably benign.
5. Aortic atherosclerosis.

Aortic Atherosclerosis ([id number]).
